# Patient Record
Sex: Male | Born: 1955 | Race: White | Hispanic: No | Smoking: Never smoker
Health system: Southern US, Community
[De-identification: ages and names within clinical notes are randomized; demographics above are authoritative.]

## PROBLEM LIST (undated history)

## (undated) DIAGNOSIS — E063 Autoimmune thyroiditis: Secondary | ICD-10-CM

## (undated) HISTORY — DX: Autoimmune thyroiditis: E06.3

## (undated) HISTORY — PX: HAND TENDON SURGERY: SHX663

---

## 2019-11-22 DIAGNOSIS — M25511 Pain in right shoulder: Secondary | ICD-10-CM | POA: Diagnosis not present

## 2019-11-22 DIAGNOSIS — Z7189 Other specified counseling: Secondary | ICD-10-CM | POA: Diagnosis not present

## 2019-11-22 DIAGNOSIS — Z8489 Family history of other specified conditions: Secondary | ICD-10-CM | POA: Diagnosis not present

## 2019-11-22 DIAGNOSIS — F339 Major depressive disorder, recurrent, unspecified: Secondary | ICD-10-CM | POA: Diagnosis not present

## 2019-11-23 DIAGNOSIS — Z125 Encounter for screening for malignant neoplasm of prostate: Secondary | ICD-10-CM | POA: Diagnosis not present

## 2019-11-23 DIAGNOSIS — E782 Mixed hyperlipidemia: Secondary | ICD-10-CM | POA: Diagnosis not present

## 2019-11-23 DIAGNOSIS — E785 Hyperlipidemia, unspecified: Secondary | ICD-10-CM | POA: Diagnosis not present

## 2019-11-23 DIAGNOSIS — E038 Other specified hypothyroidism: Secondary | ICD-10-CM | POA: Diagnosis not present

## 2019-11-23 DIAGNOSIS — E063 Autoimmune thyroiditis: Secondary | ICD-10-CM | POA: Diagnosis not present

## 2020-02-16 DIAGNOSIS — M25511 Pain in right shoulder: Secondary | ICD-10-CM | POA: Diagnosis not present

## 2020-02-16 DIAGNOSIS — F900 Attention-deficit hyperactivity disorder, predominantly inattentive type: Secondary | ICD-10-CM | POA: Diagnosis not present

## 2020-02-16 DIAGNOSIS — E038 Other specified hypothyroidism: Secondary | ICD-10-CM | POA: Diagnosis not present

## 2020-02-16 DIAGNOSIS — F339 Major depressive disorder, recurrent, unspecified: Secondary | ICD-10-CM | POA: Diagnosis not present

## 2020-03-03 DIAGNOSIS — E785 Hyperlipidemia, unspecified: Secondary | ICD-10-CM | POA: Diagnosis not present

## 2020-03-03 DIAGNOSIS — E038 Other specified hypothyroidism: Secondary | ICD-10-CM | POA: Diagnosis not present

## 2020-03-03 DIAGNOSIS — E063 Autoimmune thyroiditis: Secondary | ICD-10-CM | POA: Diagnosis not present

## 2020-03-03 DIAGNOSIS — E782 Mixed hyperlipidemia: Secondary | ICD-10-CM | POA: Diagnosis not present

## 2020-04-14 DIAGNOSIS — F900 Attention-deficit hyperactivity disorder, predominantly inattentive type: Secondary | ICD-10-CM | POA: Diagnosis not present

## 2020-04-14 DIAGNOSIS — F339 Major depressive disorder, recurrent, unspecified: Secondary | ICD-10-CM | POA: Diagnosis not present

## 2020-07-14 DIAGNOSIS — L219 Seborrheic dermatitis, unspecified: Secondary | ICD-10-CM | POA: Diagnosis not present

## 2020-07-14 DIAGNOSIS — F339 Major depressive disorder, recurrent, unspecified: Secondary | ICD-10-CM | POA: Diagnosis not present

## 2020-07-14 DIAGNOSIS — F900 Attention-deficit hyperactivity disorder, predominantly inattentive type: Secondary | ICD-10-CM | POA: Diagnosis not present

## 2020-07-14 DIAGNOSIS — G4700 Insomnia, unspecified: Secondary | ICD-10-CM | POA: Diagnosis not present

## 2020-09-15 DIAGNOSIS — F900 Attention-deficit hyperactivity disorder, predominantly inattentive type: Secondary | ICD-10-CM | POA: Diagnosis not present

## 2020-09-15 DIAGNOSIS — F339 Major depressive disorder, recurrent, unspecified: Secondary | ICD-10-CM | POA: Diagnosis not present

## 2020-09-15 DIAGNOSIS — L219 Seborrheic dermatitis, unspecified: Secondary | ICD-10-CM | POA: Diagnosis not present

## 2020-09-15 DIAGNOSIS — M6284 Sarcopenia: Secondary | ICD-10-CM | POA: Diagnosis not present

## 2020-09-28 DIAGNOSIS — R6882 Decreased libido: Secondary | ICD-10-CM | POA: Diagnosis not present

## 2020-12-20 DIAGNOSIS — Z8616 Personal history of COVID-19: Secondary | ICD-10-CM | POA: Diagnosis not present

## 2020-12-20 DIAGNOSIS — F339 Major depressive disorder, recurrent, unspecified: Secondary | ICD-10-CM | POA: Diagnosis not present

## 2020-12-20 DIAGNOSIS — F900 Attention-deficit hyperactivity disorder, predominantly inattentive type: Secondary | ICD-10-CM | POA: Diagnosis not present

## 2020-12-20 DIAGNOSIS — R21 Rash and other nonspecific skin eruption: Secondary | ICD-10-CM | POA: Diagnosis not present

## 2020-12-27 DIAGNOSIS — Z13220 Encounter for screening for lipoid disorders: Secondary | ICD-10-CM | POA: Diagnosis not present

## 2020-12-27 DIAGNOSIS — F339 Major depressive disorder, recurrent, unspecified: Secondary | ICD-10-CM | POA: Diagnosis not present

## 2020-12-27 DIAGNOSIS — Z125 Encounter for screening for malignant neoplasm of prostate: Secondary | ICD-10-CM | POA: Diagnosis not present

## 2020-12-27 DIAGNOSIS — R21 Rash and other nonspecific skin eruption: Secondary | ICD-10-CM | POA: Diagnosis not present

## 2020-12-27 DIAGNOSIS — Z8616 Personal history of COVID-19: Secondary | ICD-10-CM | POA: Diagnosis not present

## 2020-12-27 DIAGNOSIS — F900 Attention-deficit hyperactivity disorder, predominantly inattentive type: Secondary | ICD-10-CM | POA: Diagnosis not present

## 2021-03-14 DIAGNOSIS — F900 Attention-deficit hyperactivity disorder, predominantly inattentive type: Secondary | ICD-10-CM | POA: Diagnosis not present

## 2021-03-14 DIAGNOSIS — E785 Hyperlipidemia, unspecified: Secondary | ICD-10-CM | POA: Diagnosis not present

## 2021-03-14 DIAGNOSIS — E038 Other specified hypothyroidism: Secondary | ICD-10-CM | POA: Diagnosis not present

## 2021-03-14 DIAGNOSIS — F339 Major depressive disorder, recurrent, unspecified: Secondary | ICD-10-CM | POA: Diagnosis not present

## 2021-04-06 DIAGNOSIS — E039 Hypothyroidism, unspecified: Secondary | ICD-10-CM | POA: Diagnosis not present

## 2021-04-06 DIAGNOSIS — E063 Autoimmune thyroiditis: Secondary | ICD-10-CM | POA: Diagnosis not present

## 2021-04-06 DIAGNOSIS — E038 Other specified hypothyroidism: Secondary | ICD-10-CM | POA: Diagnosis not present

## 2021-05-30 ENCOUNTER — Ambulatory Visit: Payer: Self-pay | Admitting: Dermatology

## 2021-06-19 DIAGNOSIS — F900 Attention-deficit hyperactivity disorder, predominantly inattentive type: Secondary | ICD-10-CM | POA: Diagnosis not present

## 2021-06-19 DIAGNOSIS — J069 Acute upper respiratory infection, unspecified: Secondary | ICD-10-CM | POA: Diagnosis not present

## 2021-06-19 DIAGNOSIS — F339 Major depressive disorder, recurrent, unspecified: Secondary | ICD-10-CM | POA: Diagnosis not present

## 2021-06-19 DIAGNOSIS — E038 Other specified hypothyroidism: Secondary | ICD-10-CM | POA: Diagnosis not present

## 2021-09-14 DIAGNOSIS — Z7189 Other specified counseling: Secondary | ICD-10-CM | POA: Diagnosis not present

## 2021-09-14 DIAGNOSIS — E038 Other specified hypothyroidism: Secondary | ICD-10-CM | POA: Diagnosis not present

## 2021-09-14 DIAGNOSIS — F900 Attention-deficit hyperactivity disorder, predominantly inattentive type: Secondary | ICD-10-CM | POA: Diagnosis not present

## 2021-09-14 DIAGNOSIS — F339 Major depressive disorder, recurrent, unspecified: Secondary | ICD-10-CM | POA: Diagnosis not present

## 2021-10-03 DIAGNOSIS — F900 Attention-deficit hyperactivity disorder, predominantly inattentive type: Secondary | ICD-10-CM | POA: Diagnosis not present

## 2021-10-03 DIAGNOSIS — E063 Autoimmune thyroiditis: Secondary | ICD-10-CM | POA: Diagnosis not present

## 2021-10-03 DIAGNOSIS — R6882 Decreased libido: Secondary | ICD-10-CM | POA: Diagnosis not present

## 2021-10-03 DIAGNOSIS — F339 Major depressive disorder, recurrent, unspecified: Secondary | ICD-10-CM | POA: Diagnosis not present

## 2021-10-03 DIAGNOSIS — R21 Rash and other nonspecific skin eruption: Secondary | ICD-10-CM | POA: Diagnosis not present

## 2021-10-03 DIAGNOSIS — Z13220 Encounter for screening for lipoid disorders: Secondary | ICD-10-CM | POA: Diagnosis not present

## 2021-10-03 DIAGNOSIS — Z8616 Personal history of COVID-19: Secondary | ICD-10-CM | POA: Diagnosis not present

## 2021-10-03 DIAGNOSIS — E038 Other specified hypothyroidism: Secondary | ICD-10-CM | POA: Diagnosis not present

## 2021-10-03 DIAGNOSIS — E039 Hypothyroidism, unspecified: Secondary | ICD-10-CM | POA: Diagnosis not present

## 2021-10-03 DIAGNOSIS — Z125 Encounter for screening for malignant neoplasm of prostate: Secondary | ICD-10-CM | POA: Diagnosis not present

## 2022-02-25 ENCOUNTER — Ambulatory Visit (INDEPENDENT_AMBULATORY_CARE_PROVIDER_SITE_OTHER): Payer: PPO | Admitting: Family Medicine

## 2022-02-25 ENCOUNTER — Encounter: Payer: Self-pay | Admitting: Family Medicine

## 2022-02-25 VITALS — BP 116/68 | HR 85 | Ht 68.0 in | Wt 156.4 lb

## 2022-02-25 DIAGNOSIS — E559 Vitamin D deficiency, unspecified: Secondary | ICD-10-CM

## 2022-02-25 DIAGNOSIS — E063 Autoimmune thyroiditis: Secondary | ICD-10-CM | POA: Diagnosis not present

## 2022-02-25 DIAGNOSIS — F908 Attention-deficit hyperactivity disorder, other type: Secondary | ICD-10-CM

## 2022-02-25 DIAGNOSIS — F909 Attention-deficit hyperactivity disorder, unspecified type: Secondary | ICD-10-CM | POA: Diagnosis not present

## 2022-02-25 DIAGNOSIS — R7301 Impaired fasting glucose: Secondary | ICD-10-CM

## 2022-02-25 DIAGNOSIS — F988 Other specified behavioral and emotional disorders with onset usually occurring in childhood and adolescence: Secondary | ICD-10-CM

## 2022-02-25 DIAGNOSIS — Z1211 Encounter for screening for malignant neoplasm of colon: Secondary | ICD-10-CM

## 2022-02-25 DIAGNOSIS — Z1159 Encounter for screening for other viral diseases: Secondary | ICD-10-CM

## 2022-02-25 MED ORDER — AMPHETAMINE-DEXTROAMPHETAMINE 10 MG PO TABS: 30.0000 mg | ORAL_TABLET | Freq: Two times a day (BID) | ORAL | 0 refills | Status: DC

## 2022-02-25 NOTE — Progress Notes (Addendum)
? ?New Patient Office Visit ? ?Subjective:  ?Patient ID: Troy Allen, male    DOB: Aug 29, 1956  Age: 66 y.o. MRN: [id number] ? ?CC:  ?Chief Complaint  ?Patient presents with  ? New Patient (Initial Visit)  ?  Establishing care, would like a regular work up today. Possibly a referral to Endocrinology.   ? ? ?HPI ?NICHOLOS Allen is a 66 y.o. male with past medical history of Hashimoto's disease presents for establishing care. He takes Synthroid daily for his Hashimoto's but states that he has been feeling tired with low energy and poor concentration. He does not follow up with an endocrinologist for his Hashimoto because his tsh levels have been normal. He reports taking Adderall since 2017 to help with concentration and focus. ? ?Past Medical History:  ?Diagnosis Date  ? Hashimoto's disease   ? ? ?History reviewed. No pertinent surgical history. ? ?History reviewed. No pertinent family history. ? ?Social History  ? ?Socioeconomic History  ? Marital status: Divorced  ?  Spouse name: Not on file  ? Number of children: Not on file  ? Years of education: Not on file  ? Highest education level: Not on file  ?Occupational History  ? Not on file  ?Tobacco Use  ? Smoking status: Never  ?  Passive exposure: Never  ? Smokeless tobacco: Never  ?Substance and Sexual Activity  ? Alcohol use: Not Currently  ?  Comment: occasionally  ? Drug use: Never  ? Sexual activity: Yes  ?Other Topics Concern  ? Not on file  ?Social History Narrative  ? Not on file  ? ?Social Determinants of Health  ? ?Financial Resource Strain: Not on file  ?Food Insecurity: Not on file  ?Transportation Needs: Not on file  ?Physical Activity: Not on file  ?Stress: Not on file  ?Social Connections: Not on file  ?Intimate Partner Violence: Not on file  ? ? ?ROS ?Review of Systems  ?Constitutional:  Positive for fatigue. Negative for chills and fever.  ?HENT:  Negative for congestion, sinus pressure, sinus pain, sneezing and sore throat.   ?Eyes:   Negative for discharge, redness and itching.  ?Respiratory:  Negative for cough, chest tightness, shortness of breath and wheezing.   ?Cardiovascular:  Negative for chest pain and palpitations.  ?Gastrointestinal:  Negative for constipation, diarrhea, nausea and vomiting.  ?Endocrine: Negative for polydipsia, polyphagia and polyuria.  ?Genitourinary:  Negative for frequency and urgency.  ?Musculoskeletal:  Negative for back pain and neck pain.  ?Skin:  Negative for rash and wound.  ?Neurological:  Negative for dizziness, tremors, light-headedness and headaches.  ?Psychiatric/Behavioral:  Negative for confusion, self-injury and suicidal ideas.   ? ?Objective:  ? ?Today's Vitals: BP 116/68   Pulse 85   Ht _0  (1.727 m)   Wt 156 lb 6.4 oz (70.9 kg)   SpO2 98%   BMI 23.78 kg/m?  ? ?Physical Exam ?Constitutional:   ?   Appearance: Normal appearance.  ?HENT:  ?   Head: Normocephalic.  ?   Right Ear: External ear normal.  ?   Left Ear: External ear normal.  ?   Nose: No congestion or rhinorrhea.  ?   Mouth/Throat:  ?   Mouth: Mucous membranes are moist.  ?Eyes:  ?   Extraocular Movements: Extraocular movements intact.  ?   Pupils: Pupils are equal, round, and reactive to light.  ?Cardiovascular:  ?   Rate and Rhythm: Normal rate and regular rhythm.  ?   Pulses: Normal  pulses.  ?   Heart sounds: Normal heart sounds.  ?Pulmonary:  ?   Effort: Pulmonary effort is normal.  ?   Breath sounds: Normal breath sounds.  ?Abdominal:  ?   Palpations: Abdomen is soft.  ?Musculoskeletal:     ?   General: No swelling.  ?   Cervical back: No rigidity.  ?Lymphadenopathy:  ?   Cervical: No cervical adenopathy.  ?Skin: ?   General: Skin is warm.  ?   Capillary Refill: Capillary refill takes less than 2 seconds.  ?   Findings: No lesion or rash.  ?Neurological:  ?   Mental Status: He is alert and oriented to person, place, and time.  ?Psychiatric:  ?   Comments: Normal affect  ? ? ?Assessment & Plan:  ? ?Problem List Items Addressed  This Visit   ? ?  ? Endocrine  ? Hashimoto's disease - Primary  ?  Patient reports that his labs were recently drawn at his previous practice ?Records requested ?Informed patient to take liquid Vit. B12 OTC to help with his symptoms of fatigue and low energy ?Advise patient to have labs drawn 2-3 days before his next appt. ?Inform the patient that we wait and see his thyroid levels before placing a referral to see endocrinologist ? ?  ?  ? Relevant Medications  ? levothyroxine (SYNTHROID) 137 MCG tablet  ?  ? Other  ? ADD (attention deficit disorder)  ?  Refilled adderall ? ?  ?  ? Relevant Medications  ? amphetamine-dextroamphetamine (ADDERALL) 10 MG tablet  ? Other Relevant Orders  ? CBC with Differential/Platelet  ? CMP14+EGFR  ? TSH+T4F+T3Free  ? Lipid Profile  ? ?Other Visit Diagnoses   ? ? Colon cancer screening      ? Relevant Orders  ? Cologuard  ? IFG (impaired fasting glucose)      ? Relevant Orders  ? Hemoglobin A1C  ? Vitamin D deficiency      ? Relevant Orders  ? Vitamin D (25 hydroxy)  ? Need for hepatitis C screening test      ? Relevant Orders  ? Hepatitis C Antibody  ? ?  ? ? ?Outpatient Encounter Medications as of 02/25/2022  ?Medication Sig  ? levothyroxine (SYNTHROID) 137 MCG tablet Take 137 mcg by mouth daily before breakfast.  ? Venlafaxine HCl 37.5 MG TB24 Take 1 tablet by mouth daily.  ? [DISCONTINUED] amphetamine-dextroamphetamine (ADDERALL) 10 MG tablet Take 30 mg by mouth 2 (two) times daily.  ? amphetamine-dextroamphetamine (ADDERALL) 10 MG tablet Take 3 tablets (30 mg total) by mouth 2 (two) times daily.  ? ?No facility-administered encounter medications on file as of 02/25/2022.  ? ? ?Follow-up: Return in about 3 months (around 05/28/2022).  ? ?Alvira Monday, FNP ?

## 2022-02-25 NOTE — Patient Instructions (Addendum)
I appreciate the opportunity to provide care to you today! ? ?  ?Follow up:  3 months ? ?Please inform the patient that I sent an order for Synthroid. Her thyroid  ?Labs:  we will wait for your medical records and get labs at your next visit ? ?Vaccinations: please stop by your local pharmacy to receive your Tdap, shingrix, and Pneumococcal polysaccharide vaccine (PPSV23) ? ?-Colon caner screening: Cologuard ? ? Refill: Adderall ? ?  ?Please continue to a heart-healthy diet and increase your physical activities. Try to exercise for at least three times a week.  ? ? ?  ?It was a pleasure to see you and I look forward to continuing to work together on your health and well-being. ?Please do not hesitate to call the office if you need care or have questions about your care. ?  ?Have a wonderful day and week. ?With Gratitude, ?Gilmore Laroche MSN, FNP-BC  ?

## 2022-02-27 DIAGNOSIS — E063 Autoimmune thyroiditis: Secondary | ICD-10-CM | POA: Insufficient documentation

## 2022-02-27 DIAGNOSIS — F988 Other specified behavioral and emotional disorders with onset usually occurring in childhood and adolescence: Secondary | ICD-10-CM | POA: Insufficient documentation

## 2022-02-27 NOTE — Assessment & Plan Note (Addendum)
Patient reports that his labs were recently drawn at his previous practice ?Records requested ?Informed patient to take liquid Vit. B12 OTC to help with his symptoms of fatigue and low energy ?Advise patient to have labs drawn 2-3 days before his next appt. ?Inform the patient that we wait and see his thyroid levels before placing a referral to see endocrinologist ?

## 2022-02-27 NOTE — Assessment & Plan Note (Signed)
Refilled adderall.

## 2022-03-19 LAB — COLOGUARD: COLOGUARD: NEGATIVE

## 2022-03-20 NOTE — Progress Notes (Signed)
Please inform the patient that his cologurad is negative for colon cancer.

## 2022-03-31 ENCOUNTER — Emergency Department (HOSPITAL_COMMUNITY)
Admission: EM | Admit: 2022-03-31 | Discharge: 2022-03-31 | Disposition: A | Discharge status: Home / Self Care | Payer: PPO | Attending: Emergency Medicine | Admitting: Emergency Medicine

## 2022-03-31 ENCOUNTER — Emergency Department (HOSPITAL_COMMUNITY): Payer: PPO

## 2022-03-31 ENCOUNTER — Encounter (HOSPITAL_COMMUNITY): Payer: Self-pay

## 2022-03-31 ENCOUNTER — Other Ambulatory Visit: Payer: Self-pay

## 2022-03-31 DIAGNOSIS — Z23 Encounter for immunization: Secondary | ICD-10-CM | POA: Diagnosis not present

## 2022-03-31 DIAGNOSIS — S61210A Laceration without foreign body of right index finger without damage to nail, initial encounter: Secondary | ICD-10-CM | POA: Insufficient documentation

## 2022-03-31 DIAGNOSIS — W208XXA Other cause of strike by thrown, projected or falling object, initial encounter: Secondary | ICD-10-CM | POA: Diagnosis not present

## 2022-03-31 DIAGNOSIS — S6991XA Unspecified injury of right wrist, hand and finger(s), initial encounter: Secondary | ICD-10-CM | POA: Diagnosis present

## 2022-03-31 MED ORDER — TETANUS-DIPHTH-ACELL PERTUSSIS 5-2.5-18.5 LF-MCG/0.5 IM SUSY
0.5000 mL | PREFILLED_SYRINGE | Freq: Once | INTRAMUSCULAR | Status: AC
  Administered 2022-03-31: 0.5 mL via INTRAMUSCULAR
  Filled 2022-03-31: qty 0.5

## 2022-03-31 MED ORDER — LIDOCAINE HCL (PF) 1 % IJ SOLN
5.0000 mL | Freq: Once | INTRAMUSCULAR | Status: DC
Start: 2022-03-31 — End: 2022-03-31
  Filled 2022-03-31: qty 5

## 2022-03-31 NOTE — ED Triage Notes (Signed)
Pt reports the window of a storm door fell down and hit pt in the head causing a minute abrasion and pt has a lac to the R index finger that is bandaged prior to triage. Pt denies LOC and is not on anticoagulants.

## 2022-03-31 NOTE — ED Provider Notes (Signed)
[facility] EMERGENCY DEPARTMENT Provider Note   CSN: [id number] Arrival date & time: 03/31/22  1108     History  No chief complaint on file.   Troy Allen is a 66 y.o. male.  HPI     Troy Allen is a 66 y.o. male who presents to the Emergency Department complaining of laceration of his right index finger that occurred from a fall when the pain.  He states the metal portion of the window fell striking his finger.  He has a laceration to the palmar aspect of the mid to distal finger.  Bleeding controlled with direct pressure prior to arrival.  He denies any numbness, tingling of the finger or hand.  States he is able to move his finger without difficulty.  Last Td is unknown.  No pain of the wrist or arm.  He does not take anticoagulants.  Home Medications Prior to Admission medications   Medication Sig Start Date End Date Taking? Authorizing Provider  amphetamine-dextroamphetamine (ADDERALL) 10 MG tablet Take 3 tablets (30 mg total) by mouth 2 (two) times daily. 02/25/22   Gilmore Laroche, FNP  levothyroxine (SYNTHROID) 137 MCG tablet Take 137 mcg by mouth daily before breakfast.    [provider]  Venlafaxine HCl 37.5 MG TB24 Take 1 tablet by mouth daily. 01/11/22   [provider]      Allergies    Patient has no known allergies.    Review of Systems   Review of Systems  Constitutional:  Negative for chills and fever.  Respiratory:  Negative for shortness of breath.   Cardiovascular:  Negative for chest pain.  Gastrointestinal:  Negative for nausea and vomiting.  Musculoskeletal:  Positive for arthralgias (Right index finger pain).  Skin:  Positive for wound (Laceration right index finger). Negative for color change.  Neurological:  Negative for dizziness, syncope, weakness, numbness and headaches.    Physical Exam Updated Vital Signs BP 124/71 (BP Location: Left Arm)   Pulse 81   Temp 97.9 F (36.6 C) (Oral)   Resp 16   Ht 5\' 8"  (1.727  m)   Wt 68 kg   SpO2 100%   BMI 22.81 kg/m  Physical Exam Vitals and nursing note reviewed.  Constitutional:      General: He is not in acute distress.    Appearance: Normal appearance. He is not ill-appearing.  Cardiovascular:     Rate and Rhythm: Normal rate and regular rhythm.     Pulses: Normal pulses.  Pulmonary:     Effort: Pulmonary effort is normal.  Chest:     Chest wall: No tenderness.  Musculoskeletal:        General: Tenderness and signs of injury present. Normal range of motion.     Cervical back: Normal range of motion.     Comments: 2 cm laceration to the palmar aspect of the mid to distal right index finger.  Bleeding controlled.  No edema.  Patient has full range of motion of the PIP and DIP joints.  No active bleeding.  No foreign body seen.  Skin:    General: Skin is warm.     Capillary Refill: Capillary refill takes less than 2 seconds.  Neurological:     General: No focal deficit present.     Mental Status: He is alert.     Sensory: No sensory deficit.     Motor: No weakness.     ED Results / Procedures / Treatments   Labs (all labs  ordered are listed, but only abnormal results are displayed) Labs Reviewed - No data to display  EKG None  Radiology DG Finger Index Right  Result Date: 03/31/2022 CLINICAL DATA:  Trauma to finger.  Laceration. EXAM: RIGHT INDEX FINGER 2+V COMPARISON:  None Available. FINDINGS: Soft tissue laceration noted along the ventral aspect of the finger. No underlying fracture or foreign body. Chronic degenerative changes are noted at the DIP joint. IMPRESSION: Soft tissue laceration without underlying fracture or foreign body. Electronically Signed   By: Marin Roberts M.D.   On: 03/31/2022 13:21    Procedures Procedures     LACERATION REPAIR Performed by: Fountain Derusha Authorized by: Tarry Blayney Consent: Verbal consent obtained. Risks and benefits: risks, benefits and alternatives were discussed Consent given  by: patient Patient identity confirmed: provided demographic data Prepped and Draped in normal sterile fashion Wound explored  Laceration Location: right index finger  Laceration Length: 2cm  No Foreign Bodies seen or palpated  Anesthesia: local infiltration  Local anesthetic: lidocaine 1% w/o epinephrine  Anesthetic total: 2 ml  Irrigation method: syringe Amount of cleaning: standard  Skin closure: 4-0 Ethilon  Number of sutures: 4  Technique: simple interrupted  Patient tolerance: Patient tolerated the procedure well with no immediate complications.  Medications Ordered in ED Medications  lidocaine (PF) (XYLOCAINE) 1 % injection 5 mL (has no administration in time range)  Tdap (BOOSTRIX) injection 0.5 mL (has no administration in time range)    ED Course/ Medical Decision Making/ A&P                           Medical Decision Making Patient here for evaluation of laceration of his right index finger that occurred from a fall 1 windowpane.  Laceration is to the distal finger and does not involve a joint.  No injury of the nail.  Neurovascularly intact.  Bleeding controlled prior to arrival. Patient has full range of motion and normal finger thumb opposition.  Wound explored through full range of motion and entire depth of the wound.  No foreign body seen no tendon injury seen.  Wound cleaned and well approximated with sutures.  Triage note mentions abrasion to the scalp without LOC.  Patient did not mention head injury to me on my exam.  He is mentating well, ambulatory with steady gait.  Denied any pain other than finger injury.  Amount and/or Complexity of Data Reviewed Radiology: ordered.    Details: X-ray of the finger without bony injury or radiopaque foreign body. Discussion of management or test interpretation with external provider(s): TD updated.  Patient tolerated suture procedure well.  He is agreeable to wound care instructions, sutures out in 8 to 10 days.   Return precautions discussed.           Final Clinical Impression(s) / ED Diagnoses Final diagnoses:  Laceration of right index finger without foreign body without damage to nail, initial encounter    Rx / DC Orders ED Discharge Orders     None         Pauline Aus, PA-C 03/31/22 1426    Jacalyn Lefevre, MD 03/31/22 1504

## 2022-03-31 NOTE — Discharge Instructions (Signed)
Keep the wound clean with mild soap and water.  Keep it bandaged.  Sutures out in 8 to 10 days.  Return the emergency department for any worsening symptoms or signs of infection.  You may take Tylenol if needed for pain.

## 2022-03-31 NOTE — ED Notes (Signed)
Pt provided discharge instructions and prescription information. Pt was given the opportunity to ask questions and questions were answered.   

## 2022-04-23 ENCOUNTER — Other Ambulatory Visit: Payer: Self-pay

## 2022-04-23 ENCOUNTER — Other Ambulatory Visit: Payer: Self-pay | Admitting: Family Medicine

## 2022-04-23 ENCOUNTER — Telehealth: Payer: Self-pay | Admitting: Family Medicine

## 2022-04-23 DIAGNOSIS — F988 Other specified behavioral and emotional disorders with onset usually occurring in childhood and adolescence: Secondary | ICD-10-CM

## 2022-04-23 NOTE — Telephone Encounter (Addendum)
Patient called in for refill on   amphetamine-dextroamphetamine (ADDERALL) 10 MG tablet     May call patient back if needed. Patient has 1-2 days of med left.

## 2022-04-24 ENCOUNTER — Other Ambulatory Visit: Payer: Self-pay | Admitting: Family Medicine

## 2022-04-24 DIAGNOSIS — F988 Other specified behavioral and emotional disorders with onset usually occurring in childhood and adolescence: Secondary | ICD-10-CM

## 2022-04-24 MED ORDER — AMPHETAMINE-DEXTROAMPHETAMINE 10 MG PO TABS: 30.0000 mg | ORAL_TABLET | Freq: Two times a day (BID) | ORAL | 0 refills | Status: DC

## 2022-04-24 NOTE — Telephone Encounter (Signed)
Left a vm letting patient know.

## 2022-04-25 ENCOUNTER — Telehealth: Payer: Self-pay

## 2022-04-25 NOTE — Telephone Encounter (Signed)
Patient called he states the pharmacy only gave him 9 adderall tablets.He takes 3 tabs 2 times daily.Requesting a call back.[phone]

## 2022-04-26 ENCOUNTER — Other Ambulatory Visit: Payer: Self-pay | Admitting: Family Medicine

## 2022-04-26 DIAGNOSIS — F988 Other specified behavioral and emotional disorders with onset usually occurring in childhood and adolescence: Secondary | ICD-10-CM

## 2022-04-26 MED ORDER — AMPHETAMINE-DEXTROAMPHETAMINE 10 MG PO TABS: 30.0000 mg | ORAL_TABLET | Freq: Two times a day (BID) | ORAL | 0 refills | Status: DC

## 2022-04-26 NOTE — Telephone Encounter (Signed)
Refills have been sent. Please inform the patient of the current dose that he is taking is not safe. I plan on titrating the dose from 30 to 20 to 15 and 10mg .

## 2022-04-26 NOTE — Telephone Encounter (Signed)
Called pt back states he uses to be on 30mg  2x a day, but last year there was a short supply of adderal in pharmacies so this was the only way he could have the prescription written based on the supply at pharmacy, states he was getting refills from previous PCP, would like to have his regular script written if not he doesn't know where he can get this from. Please advice?

## 2022-04-26 NOTE — Telephone Encounter (Signed)
Pt informed

## 2022-05-13 ENCOUNTER — Other Ambulatory Visit: Payer: Self-pay | Admitting: Family Medicine

## 2022-05-13 DIAGNOSIS — F988 Other specified behavioral and emotional disorders with onset usually occurring in childhood and adolescence: Secondary | ICD-10-CM

## 2022-05-28 ENCOUNTER — Ambulatory Visit: Payer: PPO | Admitting: Family Medicine

## 2022-05-28 NOTE — Telephone Encounter (Signed)
Please inform the patient that we will discuss his refill at his appt tomorrow

## 2022-06-03 NOTE — Telephone Encounter (Signed)
Left a vm informing pt.  

## 2022-06-05 ENCOUNTER — Ambulatory Visit (INDEPENDENT_AMBULATORY_CARE_PROVIDER_SITE_OTHER): Payer: PPO | Admitting: Family Medicine

## 2022-06-05 ENCOUNTER — Encounter: Payer: Self-pay | Admitting: Family Medicine

## 2022-06-05 VITALS — BP 115/71 | HR 74 | Ht 68.0 in | Wt 158.1 lb

## 2022-06-05 DIAGNOSIS — E063 Autoimmune thyroiditis: Secondary | ICD-10-CM

## 2022-06-05 DIAGNOSIS — Z1159 Encounter for screening for other viral diseases: Secondary | ICD-10-CM

## 2022-06-05 DIAGNOSIS — E559 Vitamin D deficiency, unspecified: Secondary | ICD-10-CM | POA: Diagnosis not present

## 2022-06-05 DIAGNOSIS — R7301 Impaired fasting glucose: Secondary | ICD-10-CM

## 2022-06-05 DIAGNOSIS — F321 Major depressive disorder, single episode, moderate: Secondary | ICD-10-CM

## 2022-06-05 DIAGNOSIS — F908 Attention-deficit hyperactivity disorder, other type: Secondary | ICD-10-CM

## 2022-06-05 DIAGNOSIS — F32A Depression, unspecified: Secondary | ICD-10-CM | POA: Insufficient documentation

## 2022-06-05 MED ORDER — LEVOTHYROXINE SODIUM 137 MCG PO TABS: 137.0000 ug | ORAL_TABLET | Freq: Every day | ORAL | 2 refills | Status: DC

## 2022-06-05 MED ORDER — AMPHETAMINE-DEXTROAMPHETAMINE 20 MG PO TABS: 20.0000 mg | ORAL_TABLET | Freq: Two times a day (BID) | ORAL | 0 refills | Status: DC

## 2022-06-05 MED ORDER — VENLAFAXINE HCL ER 37.5 MG PO TB24: 1.0000 | ORAL_TABLET | Freq: Every day | ORAL | 2 refills | Status: DC

## 2022-06-05 NOTE — Assessment & Plan Note (Signed)
Reports taking  Adderall since 2017 to help with concentration and focus Will titrate pt to  Adderall 20 mg BID Referral to Psychiatry

## 2022-06-05 NOTE — Progress Notes (Signed)
Established Patient Office Visit  Subjective:  Patient ID: RECE ZECHMAN, male    DOB: 1956-09-01  Age: 66 y.o. MRN: [id number]  CC:  Chief Complaint  Patient presents with   Follow-up    Follow up appt, would like to discuss adhd medication     HPI Troy Allen is a 66 y.o. male with past medical history of ADD, Depression and Hashimoto's disease presents for f/u of  chronic medical conditions. Hashimoto's disease: He takes Synthroid daily for his Hashimoto's but states that he has been feeling tired with low energy and poor concentration. He does not follow up with an endocrinologist for his Hashimoto because his tsh levels have been normal Attention deficit disorder: reports taking  Adderall since 2017 to help with concentration and focus Depression: c/o of increased fatigue but is unsure if his fatigue is due to his depression or hashimoto's. He takes venlafaxine daily.   Past Medical History:  Diagnosis Date   Hashimoto's disease     Past Surgical History:  Procedure Laterality Date   HAND TENDON SURGERY Left     History reviewed. No pertinent family history.  Social History   Socioeconomic History   Marital status: Divorced    Spouse name: Not on file   Number of children: Not on file   Years of education: Not on file   Highest education level: Not on file  Occupational History   Not on file  Tobacco Use   Smoking status: Never    Passive exposure: Never   Smokeless tobacco: Never  Vaping Use   Vaping Use: Every day  Substance and Sexual Activity   Alcohol use: Yes    Comment: occasionally   Drug use: Yes    Types: Marijuana   Sexual activity: Yes  Other Topics Concern   Not on file  Social History Narrative   Not on file   Social Determinants of Health   Financial Resource Strain: Not on file  Food Insecurity: Not on file  Transportation Needs: Not on file  Physical Activity: Not on file  Stress: Not on file  Social Connections: Not on  file  Intimate Partner Violence: Not on file    Outpatient Medications Prior to Visit  Medication Sig Dispense Refill   amphetamine-dextroamphetamine (ADDERALL) 10 MG tablet Take 3 tablets (30 mg total) by mouth 2 (two) times daily. 51 tablet 0   levothyroxine (SYNTHROID) 137 MCG tablet Take 137 mcg by mouth daily before breakfast.     Venlafaxine HCl 37.5 MG TB24 Take 1 tablet by mouth daily.     No facility-administered medications prior to visit.    No Known Allergies  ROS Review of Systems    Objective:    Physical Exam  BP 115/71   Pulse 74   Ht $R'5\' 8"'Dl$  (1.727 m)   Wt 158 lb 1.9 oz (71.7 kg)   SpO2 95%   BMI 24.04 kg/m  Wt Readings from Last 3 Encounters:  06/05/22 158 lb 1.9 oz (71.7 kg)  03/31/22 150 lb (68 kg)  02/25/22 156 lb 6.4 oz (70.9 kg)    No results found for: "TSH" No results found for: "WBC", "HGB", "HCT", "MCV", "PLT" No results found for: "NA", "K", "CHLORIDE", "CO2", "GLUCOSE", "BUN", "CREATININE", "BILITOT", "ALKPHOS", "AST", "ALT", "PROT", "ALBUMIN", "CALCIUM", "ANIONGAP", "EGFR", "GFR" No results found for: "CHOL" No results found for: "HDL" No results found for: "LDLCALC" No results found for: "TRIG" No results found for: "CHOLHDL" No results found for: "HGBA1C"  Assessment & Plan:   Problem List Items Addressed This Visit       Endocrine   Hashimoto's disease - Primary    He takes Synthroid daily for his Hashimoto's but states that he has been feeling tired with low energy and poor concentration He does not follow up with an endocrinologist for his Hashimoto because his tsh levels have been normal Pending labs (TSH And free T4)      Relevant Medications   levothyroxine (SYNTHROID) 137 MCG tablet     Other   ADD (attention deficit disorder)    Reports taking  Adderall since 2017 to help with concentration and focus Will titrate pt to  Adderall 20 mg BID Referral to Psychiatry       Relevant Medications    amphetamine-dextroamphetamine (ADDERALL) 20 MG tablet   Other Relevant Orders   Ambulatory referral to Psychiatry   Depression    c/o of increased fatigue but is unsure if his fatigue is due to his depression or Hashimoto's He takes venlafaxine daily Refilled venlafaxine      Relevant Medications   Venlafaxine HCl 37.5 MG TB24   Other Relevant Orders   Ambulatory referral to Psychiatry   Other Visit Diagnoses     Need for hepatitis C screening test       Relevant Orders   Hepatitis C Antibody   Vitamin D deficiency       Relevant Orders   VITAMIN D 25 Hydroxy (Vit-D Deficiency, Fractures)   IFG (impaired fasting glucose)       Relevant Orders   CBC with Differential/Platelet   CMP14+EGFR   TSH + free T4   Lipid Profile   Hemoglobin A1C       Meds ordered this encounter  Medications   amphetamine-dextroamphetamine (ADDERALL) 20 MG tablet    Sig: Take 1 tablet (20 mg total) by mouth 2 (two) times daily.    Dispense:  60 tablet    Refill:  0   levothyroxine (SYNTHROID) 137 MCG tablet    Sig: Take 1 tablet (137 mcg total) by mouth daily before breakfast.    Dispense:  30 tablet    Refill:  2   Venlafaxine HCl 37.5 MG TB24    Sig: Take 1 tablet (37.5 mg total) by mouth daily.    Dispense:  30 tablet    Refill:  2    Follow-up: Return in about 3 months (around 09/05/2022).    Alvira Monday, FNP

## 2022-06-05 NOTE — Assessment & Plan Note (Signed)
c/o of increased fatigue but is unsure if his fatigue is due to his depression or Hashimoto's He takes venlafaxine daily Refilled venlafaxine

## 2022-06-05 NOTE — Patient Instructions (Signed)
I appreciate the opportunity to provide care to you today!    Follow up:  3 months  Labs: please stop by the lab tomorrow to get your blood drawn (CBC, CMP, TSH, Lipid profile, HgA1c, Vit D)  Screening: Hep C  Please pick up your refills at the pharmacy  Referrals today- psychiatry   Please continue to a heart-healthy diet and increase your physical activities. Try to exercise for at least three times a week.      It was a pleasure to see you and I look forward to continuing to work together on your health and well-being. Please do not hesitate to call the office if you need care or have questions about your care.   Have a wonderful day and week. With Gratitude, Gilmore Laroche MSN, FNP-BC

## 2022-06-05 NOTE — Assessment & Plan Note (Signed)
He takes Synthroid daily for his Hashimoto's but states that he has been feeling tired with low energy and poor concentration He does not follow up with an endocrinologist for his Hashimoto because his tsh levels have been normal Pending labs (TSH And free T4)

## 2022-06-07 LAB — CBC WITH DIFFERENTIAL/PLATELET
Basophils Absolute: 0.1 10*3/uL (ref 0.0–0.2)
Basos: 1 %
EOS (ABSOLUTE): 0.3 10*3/uL (ref 0.0–0.4)
Eos: 4 %
Hematocrit: 40.6 % (ref 37.5–51.0)
Hemoglobin: 13.6 g/dL (ref 13.0–17.7)
Immature Grans (Abs): 0 10*3/uL (ref 0.0–0.1)
Immature Granulocytes: 0 %
Lymphocytes Absolute: 2.1 10*3/uL (ref 0.7–3.1)
Lymphs: 32 %
MCH: 33.3 pg — ABNORMAL HIGH (ref 26.6–33.0)
MCHC: 33.5 g/dL (ref 31.5–35.7)
MCV: 99 fL — ABNORMAL HIGH (ref 79–97)
Monocytes Absolute: 0.6 10*3/uL (ref 0.1–0.9)
Monocytes: 9 %
Neutrophils Absolute: 3.5 10*3/uL (ref 1.4–7.0)
Neutrophils: 54 %
Platelets: 274 10*3/uL (ref 150–450)
RBC: 4.09 x10E6/uL — ABNORMAL LOW (ref 4.14–5.80)
RDW: 12.7 % (ref 11.6–15.4)
WBC: 6.6 10*3/uL (ref 3.4–10.8)

## 2022-06-07 LAB — CMP14+EGFR
ALT: 17 IU/L (ref 0–44)
AST: 19 IU/L (ref 0–40)
Albumin/Globulin Ratio: 1.6 (ref 1.2–2.2)
Albumin: 4.1 g/dL (ref 3.9–4.9)
Alkaline Phosphatase: 65 IU/L (ref 44–121)
BUN/Creatinine Ratio: 15 (ref 10–24)
BUN: 13 mg/dL (ref 8–27)
Bilirubin Total: 0.3 mg/dL (ref 0.0–1.2)
CO2: 26 mmol/L (ref 20–29)
Calcium: 9.2 mg/dL (ref 8.6–10.2)
Chloride: 100 mmol/L (ref 96–106)
Creatinine, Ser: 0.87 mg/dL (ref 0.76–1.27)
Globulin, Total: 2.5 g/dL (ref 1.5–4.5)
Glucose: 84 mg/dL (ref 70–99)
Potassium: 4.7 mmol/L (ref 3.5–5.2)
Sodium: 138 mmol/L (ref 134–144)
Total Protein: 6.6 g/dL (ref 6.0–8.5)
eGFR: 95 mL/min/{1.73_m2} (ref >59)

## 2022-06-07 LAB — TSH+FREE T4
Free T4: 1.51 ng/dL (ref 0.82–1.77)
TSH: 0.952 u[IU]/mL (ref 0.450–4.500)

## 2022-06-07 LAB — HEPATITIS C ANTIBODY: Hep C Virus Ab: NONREACTIVE

## 2022-06-07 LAB — LIPID PANEL
Chol/HDL Ratio: 4.6 ratio (ref 0.0–5.0)
Cholesterol, Total: 189 mg/dL (ref 100–199)
HDL: 41 mg/dL (ref >39)
LDL Chol Calc (NIH): 128 mg/dL — ABNORMAL HIGH (ref 0–99)
Triglycerides: 111 mg/dL (ref 0–149)
VLDL Cholesterol Cal: 20 mg/dL (ref 5–40)

## 2022-06-07 LAB — HEMOGLOBIN A1C
Est. average glucose Bld gHb Est-mCnc: 111 mg/dL
Hgb A1c MFr Bld: 5.5 % (ref 4.8–5.6)

## 2022-06-07 LAB — VITAMIN D 25 HYDROXY (VIT D DEFICIENCY, FRACTURES): Vit D, 25-Hydroxy: 33.9 ng/mL (ref 30.0–100.0)

## 2022-06-10 ENCOUNTER — Ambulatory Visit (INDEPENDENT_AMBULATORY_CARE_PROVIDER_SITE_OTHER): Payer: PPO

## 2022-06-10 DIAGNOSIS — Z0000 Encounter for general adult medical examination without abnormal findings: Secondary | ICD-10-CM | POA: Diagnosis not present

## 2022-06-10 NOTE — Progress Notes (Signed)
I connected with  [name] on 06/10/22 by a audio enabled telemedicine application and verified that I am speaking with the correct person using two identifiers.  Patient Location: Home  Provider Location: Office/Clinic  I discussed the limitations of evaluation and management by telemedicine. The patient expressed understanding and agreed to proceed.  Subjective:   Troy Allen is a 66 y.o. male who presents for Medicare Annual/Subsequent preventive examination.  Review of Systems     Cardiac Risk Factors include: advanced age (>70men, >56 women);smoking/ tobacco exposure;male gender     Objective:    There were no vitals filed for this visit. There is no height or weight on file to calculate BMI.     06/10/2022   12:33 PM 03/31/2022   11:50 AM  Advanced Directives  Does Patient Have a Medical Advance Directive? No No  Would patient like information on creating a medical advance directive? Yes (ED - Information included in AVS)     Current Medications (verified) Outpatient Encounter Medications as of 06/10/2022  Medication Sig   amphetamine-dextroamphetamine (ADDERALL) 10 MG tablet Take 3 tablets (30 mg total) by mouth 2 (two) times daily.   amphetamine-dextroamphetamine (ADDERALL) 20 MG tablet Take 1 tablet (20 mg total) by mouth 2 (two) times daily.   levothyroxine (SYNTHROID) 137 MCG tablet Take 1 tablet (137 mcg total) by mouth daily before breakfast.   Venlafaxine HCl 37.5 MG TB24 Take 1 tablet (37.5 mg total) by mouth daily.   No facility-administered encounter medications on file as of 06/10/2022.    Allergies (verified) Patient has no known allergies.   History: Past Medical History:  Diagnosis Date   Hashimoto's disease    Past Surgical History:  Procedure Laterality Date   HAND TENDON SURGERY Left    No family history on file. Social History   Socioeconomic History   Marital status: Divorced    Spouse name: Not on file   Number of  children: Not on file   Years of education: Not on file   Highest education level: Not on file  Occupational History   Not on file  Tobacco Use   Smoking status: Never    Passive exposure: Never   Smokeless tobacco: Never  Vaping Use   Vaping Use: Every day  Substance and Sexual Activity   Alcohol use: Yes    Comment: occasionally   Drug use: Yes    Types: Marijuana   Sexual activity: Yes  Other Topics Concern   Not on file  Social History Narrative   Not on file   Social Determinants of Health   Financial Resource Strain: Not on file  Food Insecurity: Not on file  Transportation Needs: No Transportation Needs (06/10/2022)   PRAPARE - Transportation    Lack of Transportation (Medical): No    Lack of Transportation (Non-Medical): No  Physical Activity: Sufficiently Active (06/10/2022)   Exercise Vital Sign    Days of Exercise per Week: 5 days    Minutes of Exercise per Session: 60 min  Stress: Not on file  Social Connections: Not on file    Tobacco Counseling Counseling given: Not Answered   Clinical Intake:  Pre-visit preparation completed: No  Pain : No/denies pain     Nutritional Status: BMI of 19-24  Normal     Diabetic?na         Activities of Daily Living    06/10/2022   12:36 PM  In your present state of health, do you have any  difficulty performing the following activities:  Hearing? 0  Vision? 0  Difficulty concentrating or making decisions? 0  Walking or climbing stairs? 0  Dressing or bathing? 0  Doing errands, shopping? 0  Preparing Food and eating ? N  Using the Toilet? N  In the past six months, have you accidently leaked urine? N  Do you have problems with loss of bowel control? N  Managing your Medications? N  Managing your Finances? N  Housekeeping or managing your Housekeeping? N    Patient Care Team: Gilmore Laroche, FNP as PCP - General (Family Medicine)  Indicate any recent Medical Services you may have received from  other than [facility] providers in the past year (date may be approximate).     Assessment:   This is a routine wellness examination for Troy Allen.  Hearing/Vision screen No results found.  Dietary issues and exercise activities discussed: Current Exercise Habits: Structured exercise class, Type of exercise: strength training/weights;stretching;treadmill, Time (Minutes): 50, Frequency (Times/Week): 6, Weekly Exercise (Minutes/Week): 300, Intensity: Moderate, Exercise limited by: None identified   Goals Addressed             This Visit's Progress    Have 3 meals a day       Prevent falls         Depression Screen    06/05/2022   11:35 AM 02/25/2022    1:47 PM  PHQ 2/9 Scores  PHQ - 2 Score 0 2  PHQ- 9 Score  3    Fall Risk    06/05/2022   11:35 AM 02/25/2022    1:47 PM  Fall Risk   Falls in the past year? 0 0  Number falls in past yr: 0 0  Injury with Fall? 0 0  Risk for fall due to : No Fall Risks No Fall Risks  Follow up Falls evaluation completed Falls evaluation completed    FALL RISK PREVENTION PERTAINING TO THE HOME:  Any stairs in or around the home? No  If so, are there any without handrails? No  Home free of loose throw rugs in walkways, pet beds, electrical cords, etc? Yes  Adequate lighting in your home to reduce risk of falls? Yes   ASSISTIVE DEVICES UTILIZED TO PREVENT FALLS:  Life alert? No  Use of a cane, walker or w/c? No  Grab bars in the bathroom? No  Shower chair or bench in shower? No  Elevated toilet seat or a handicapped toilet? No    Cognitive Function:        Immunizations Immunization History  Administered Date(s) Administered   Tdap 03/31/2022    TDAP status: Up to date  Flu Vaccine status: Due, Education has been provided regarding the importance of this vaccine. Advised may receive this vaccine at local pharmacy or Health Dept. Aware to provide a copy of the vaccination record if obtained from local pharmacy or Health Dept.  Verbalized acceptance and understanding.  Pneumococcal vaccine status: Due, Education has been provided regarding the importance of this vaccine. Advised may receive this vaccine at local pharmacy or Health Dept. Aware to provide a copy of the vaccination record if obtained from local pharmacy or Health Dept. Verbalized acceptance and understanding.  Covid-19 vaccine status: Completed vaccines  Qualifies for Shingles Vaccine? Yes   Zostavax completed No   Shingrix Completed?: No.    Education has been provided regarding the importance of this vaccine. Patient has been advised to call insurance company to determine out of pocket expense if  they have not yet received this vaccine. Advised may also receive vaccine at local pharmacy or Health Dept. Verbalized acceptance and understanding.  Screening Tests Health Maintenance  Topic Date Due   COVID-19 Vaccine (1) Never done   Zoster Vaccines- Shingrix (1 of 2) Never done   Pneumonia Vaccine 98+ Years old (1 - PCV) Never done   INFLUENZA VACCINE  05/14/2022   Fecal DNA (Cologuard)  03/07/2025   TETANUS/TDAP  03/31/2032   Hepatitis C Screening  Completed   HPV VACCINES  Aged Out    Health Maintenance  Health Maintenance Due  Topic Date Due   COVID-19 Vaccine (1) Never done   Zoster Vaccines- Shingrix (1 of 2) Never done   Pneumonia Vaccine 60+ Years old (1 - PCV) Never done   INFLUENZA VACCINE  05/14/2022    Colorectal cancer screening: Type of screening: Cologuard. Completed 2023. Repeat every 3 years  Lung Cancer Screening: (Low Dose CT Chest recommended if Age 67-80 years, 30 pack-year currently smoking OR have quit w/in 15years.) does not qualify.   Lung Cancer Screening Referral: na   Additional Screening:  Hepatitis C Screening: does not qualify; Completed   Vision Screening: Recommended annual ophthalmology exams for early detection of glaucoma and other disorders of the eye. Is the patient up to date with their annual eye  exam?  Yes  Who is the provider or what is the name of the office in which the patient attends annual eye exams? [facility]  If pt is not established with a provider, would they like to be referred to a provider to establish care? No .   Dental Screening: Recommended annual dental exams for proper oral hygiene  Community Resource Referral / Chronic Care Management: CRR required this visit?  No   CCM required this visit?  No      Plan:     I have personally reviewed and noted the following in the patient's chart:   Medical and social history Use of alcohol, tobacco or illicit drugs  Current medications and supplements including opioid prescriptions. Patient is not currently taking opioid prescriptions. Functional ability and status Nutritional status Physical activity Advanced directives List of other physicians Hospitalizations, surgeries, and ER visits in previous 12 months Vitals Screenings to include cognitive, depression, and falls Referrals and appointments  In addition, I have reviewed and discussed with patient certain preventive protocols, quality metrics, and best practice recommendations. A written personalized care plan for preventive services as well as general preventive health recommendations were provided to patient.     Abner Greenspan, LPN   1/61/0960   Nurse Notes: Schedule your next wellness visit for 1 year at checkout

## 2022-06-10 NOTE — Patient Instructions (Signed)
  Troy Allen , Thank you for taking time to come for your Medicare Wellness Visit. I appreciate your ongoing commitment to your health goals. Please review the following plan we discussed and let me know if I can assist you in the future.   These are the goals we discussed:   Goals      Have 3 meals a day     Prevent falls        This is a list of the screening recommended for you and due dates:  Health Maintenance  Topic Date Due   COVID-19 Vaccine (1) Never done   Zoster (Shingles) Vaccine (1 of 2) Never done   Pneumonia Vaccine (1 - PCV) Never done   Flu Shot  05/14/2022   Cologuard (Stool DNA test)  03/07/2025   Tetanus Vaccine  03/31/2032   Hepatitis C Screening: USPSTF Recommendation to screen - Ages 18-79 yo.  Completed   HPV Vaccine  Aged Out

## 2022-06-11 NOTE — Progress Notes (Signed)
Please inform the patient that his LDL is elevated. I want his LDL to be <100. I recommend low carbs and fat diet

## 2022-06-14 ENCOUNTER — Telehealth: Payer: Self-pay

## 2022-06-14 NOTE — Telephone Encounter (Signed)
Please advise 

## 2022-06-14 NOTE — Telephone Encounter (Signed)
Marchelle Folks called from [facility] asked if could do the capsules instead of tablets, the tablets are not covered by his insurance.   Call back # doctor line at pharmacy: [phone]  Venlafaxine HCl 37.5 MG TB24

## 2022-06-14 NOTE — Telephone Encounter (Signed)
Could you please sent the capsules

## 2022-06-14 NOTE — Telephone Encounter (Signed)
Spoke with pharmacy gave ok

## 2022-06-20 ENCOUNTER — Telehealth (HOSPITAL_COMMUNITY): Payer: PPO | Admitting: Psychiatry

## 2022-07-02 ENCOUNTER — Other Ambulatory Visit: Payer: Self-pay | Admitting: Family Medicine

## 2022-07-02 DIAGNOSIS — F908 Attention-deficit hyperactivity disorder, other type: Secondary | ICD-10-CM

## 2022-07-05 ENCOUNTER — Telehealth: Payer: Self-pay

## 2022-07-05 NOTE — Telephone Encounter (Signed)
Pt states he is scheduled in [place] in the same network on 10/03, is needed medication to cover until he gets in to see them and is going out of town needs medication sent to pharmacy.

## 2022-07-05 NOTE — Telephone Encounter (Signed)
Please ask the patient  to provide [place] with a psychiatrist who is in his network

## 2022-07-05 NOTE — Telephone Encounter (Signed)
A referral was placed to psychiatry. Can you please follow up on the referral?

## 2022-07-05 NOTE — Telephone Encounter (Signed)
Pt cancelled appt with referring office, said they were not in network with his insurance

## 2022-07-05 NOTE — Telephone Encounter (Signed)
Patient called back in regard to previous tele.   Patient cannot get appt with psychiatrist until 10/3.  Wants to have med refilled until then. Also wants a call back in regard.

## 2022-07-05 NOTE — Telephone Encounter (Signed)
Patient called need med refill ASAP patient is going out of town.  amphetamine-dextroamphetamine (ADDERALL) 20 MG tablet   Pharmacy: [facility].

## 2022-07-10 ENCOUNTER — Other Ambulatory Visit: Payer: Self-pay | Admitting: Family Medicine

## 2022-07-10 DIAGNOSIS — F988 Other specified behavioral and emotional disorders with onset usually occurring in childhood and adolescence: Secondary | ICD-10-CM

## 2022-07-10 DIAGNOSIS — F908 Attention-deficit hyperactivity disorder, other type: Secondary | ICD-10-CM

## 2022-07-10 MED ORDER — AMPHETAMINE-DEXTROAMPHETAMINE 20 MG PO TABS: 20.0000 mg | ORAL_TABLET | Freq: Two times a day (BID) | ORAL | 0 refills | Status: DC

## 2022-07-10 NOTE — Telephone Encounter (Signed)
A short supply of Amphetamine-Dextroamphetamine has been sent to the pharmacy, please encourage patient to follow up with psychiatry

## 2022-07-10 NOTE — Telephone Encounter (Signed)
Pt states he is frustrated due to him being out of town now, would like for this rx to be cancelled and would like an rx sent to [place] when he arrives there he will call the office with name of pharmacy is this okay?

## 2022-07-12 ENCOUNTER — Other Ambulatory Visit: Payer: Self-pay | Admitting: Family Medicine

## 2022-07-12 ENCOUNTER — Other Ambulatory Visit: Payer: Self-pay

## 2022-07-12 ENCOUNTER — Telehealth: Payer: Self-pay

## 2022-07-12 DIAGNOSIS — F908 Attention-deficit hyperactivity disorder, other type: Secondary | ICD-10-CM

## 2022-07-12 MED ORDER — AMPHETAMINE-DEXTROAMPHETAMINE 20 MG PO TABS: 20.0000 mg | ORAL_TABLET | Freq: Two times a day (BID) | ORAL | 0 refills | Status: DC

## 2022-07-12 NOTE — Telephone Encounter (Signed)
Patient called left voice mail during lunch hours needs his prescription transferred to the   [place] [address]  This needs to happen today since he will be there only tonight and tomorrow night  Patient # [phone]  amphetamine-dextroamphetamine (ADDERALL) 10 MG tablet   amphetamine-dextroamphetamine (ADDERALL) 20 MG tablet

## 2022-07-12 NOTE — Telephone Encounter (Signed)
Sent!

## 2022-07-12 NOTE — Telephone Encounter (Signed)
Tried to send the rx to pharmacy given, got a pop up box saying I do not have authority to send this, pharmacy is updated in his chart can you please send?

## 2022-07-12 NOTE — Telephone Encounter (Signed)
Upon calling pt to inform him of refill, pt states behavioral health doctor cancelled the appointment he had coming up and is now battling with them to get him in sooner since all he has is the short supply sent, pt could not tell me the name of the provider said appt was within [place] in [place], I looked at his upcoming, past, and cancelled appts there is nothing that was scheduled for behavioral health in his chart.

## 2022-07-22 ENCOUNTER — Ambulatory Visit (HOSPITAL_COMMUNITY): Payer: PPO | Admitting: Student in an Organized Health Care Education/Training Program

## 2022-07-30 ENCOUNTER — Telehealth: Payer: Self-pay | Admitting: Family Medicine

## 2022-07-30 ENCOUNTER — Other Ambulatory Visit: Payer: Self-pay | Admitting: Family Medicine

## 2022-07-30 DIAGNOSIS — F908 Attention-deficit hyperactivity disorder, other type: Secondary | ICD-10-CM

## 2022-07-30 NOTE — Telephone Encounter (Signed)
Left a vm letting pt know [facility] will not be filling medication he is welcome to see Dr. Doren Custard, lmtrc if any questions.

## 2022-07-30 NOTE — Telephone Encounter (Signed)
Please inform the patient that I am unable to refill Adderall 20 mg.

## 2022-07-30 NOTE — Telephone Encounter (Signed)
Pt called stating he is completely out of amphetamine-dextroamphetamine (ADDERALL) 20 MG tablet. States he had sent a Pharmacist, community request. States he is tired of doing this every couple weeks and this is not working. States he is unable to get an appt with behavioral health. Wants to know if you can please refill?       (States if we are unable to refill he will be changing drs)

## 2022-08-13 ENCOUNTER — Ambulatory Visit (HOSPITAL_BASED_OUTPATIENT_CLINIC_OR_DEPARTMENT_OTHER): Payer: PPO | Admitting: Psychiatry

## 2022-08-13 ENCOUNTER — Encounter (HOSPITAL_COMMUNITY): Payer: Self-pay | Admitting: Psychiatry

## 2022-08-13 DIAGNOSIS — E063 Autoimmune thyroiditis: Secondary | ICD-10-CM | POA: Diagnosis not present

## 2022-08-13 DIAGNOSIS — F321 Major depressive disorder, single episode, moderate: Secondary | ICD-10-CM | POA: Diagnosis not present

## 2022-08-13 MED ORDER — VENLAFAXINE HCL ER 37.5 MG PO TB24: 1.0000 | ORAL_TABLET | Freq: Every day | ORAL | 2 refills | Status: DC

## 2022-08-13 MED ORDER — BUPROPION HCL ER (XL) 150 MG PO TB24: 150.0000 mg | ORAL_TABLET | ORAL | 2 refills | Status: DC

## 2022-08-13 NOTE — Progress Notes (Signed)
Psychiatric Initial Adult Assessment   Patient Identification: Troy Allen MRN:  [id number] Date of Evaluation:  08/13/2022 Referral Source: PCP Chief Complaint:   Chief Complaint  Patient presents with   Establish Care   Depression   ADD   Visit Diagnosis:    ICD-10-CM   1. Current moderate episode of major depressive disorder, unspecified whether recurrent (HCC)  F32.1     2. Hashimoto's disease  E06.3        Assessment:  Troy Allen is a 66 y.o. y.o. male with a history of depression, hashimoto's disease, and reported ADD who presents virtually to [facility] for initial evaluation on 08/13/2022.  Patient reports a history of depression and ADHD though the depression is currently well controlled.  He reports experiencing symptoms of fatigue, low mood, amotivation, and anhedonia during periods of depression.  He has had 1 instance of passive SI with no plan or intent over 20 years ago.  He denies any SI/HI or thoughts of self-harm at this time along with any history of mania, psychosis, paranoia, or delusions.  Patient also endorses symptoms of poor concentration, increased sluggishness, fatigue, and impaired memory.  Patient feels these symptoms have improved when he has taken Adderall in the past.  Of note patient has a past history of Hashimoto's disease which she is on Synthroid and TSH is currently within normal limits.  Patient meets criteria for MDD currently stable and further clarity via neuropsych testing is needed to rule out ADHD.  We will start bupropion as an adjunct to the venlafaxine for mood and concentration symptoms.  Risks and benefits were discussed.  A number of assessments were performed during the evaluation today including nutritional assessment which was 0, pain assessment which showed no pain, PHQ-9 which they scored a 0 on, GAD-7 which they scored a 6 on, and [place] suicide severity screening which showed no  risk.  Based on these assessments patient would benefit from medication adjustment to better target their symptoms.  Plan: - Continue Venlafaxine 37.5 mg QD - Start Bupropion XL 150 mg - Discontinue Adderall 30 mg BID - CMP, CBC, lipid profile, vitamin D, TSH, and free T4 reviewed - Neuropsych testing referral - Crisis resources reviewed - Follow up in a month  History of Present Illness: She also presents reporting that the main reason he has come to establish a psychiatric provider.  He reports that he has a diagnosis of depression and ADHD and has had difficulty finding treatment for his ADHD over the last year.  On exploration of patient's past psychiatric history he notes that he started seeing a therapist in [place] around 1995 for depression and then later was connected with a psychiatrist.  He had tried a number of medication before finally noticing benefit from Effexor.  These include Prozac which she felt did not help and bupropion which was only mildly effective.  Due to patient not having insurance for a period he discontinued his medications and notes experiencing severe withdrawal off the Effexor.  It was until several years later that he was able to get insurance again and reconnect with a new psychiatric provider.  At this point he connect with provider who restarted the Effexor and diagnosed the patient with ADHD starting Adderall.  Patient believes this was around 8 to 10 years ago.  Around 3 to 4 years ago patient notes that he opted to move back to the [place] area and connected with his primary  Dr. Sudie Bailey who manages his psychiatric medications for a couple years  Due to issues with his PCP's office patient opted to change to a new provider however this provider was not open to continuing the Adderall.  Due to this patient has reached out for psychiatric establishment.  We discussed patient's symptoms and he reports that his depression is currently stable with the only  occasional need to cry for no apparent reason which happens rarely.  In the past he experienced periods of no motivation, anhedonia, and fatigue with the one instance of passive suicidal ideation in 1995.  Patient notes his last episode of more severe depression was in 2020.  As for his ADHD symptoms patient describes it as feeling like he struggles to get anything accomplished.  He reports putting 3 times the effort to get the same amount accomplished compared to when he is on stimulants versus when he is not.  In addition when he is not taking stimulants he finds himself getting tired and struggling to stay awake to the point that he had stopped driving at one point.  Patient will often increase his caffeine use when he does not have stimulants.  Patient reports he has never had a sleep study.  Went over treatment options including medications and behavioral modifications.  Patient notes that he feels the Effexor is at a good dose and has tried higher doses but felt they just made him feel more numb.  He also notes having attempted to try the stimulants in the past but never had them approved by insurance.  We explained that neuropsych testing is needed to start was on a stimulant while patient is frustrated he was open to doing this.  We also explored alternative options to help with ADHD such as bupropion which patient was open to retrying.  We also discussed the importance of discontinuing his marijuana usage as it can negatively impact memory and concentration symptoms.  Of note patient reports he has Hashimoto's disease for which she takes Synthroid.  While his TSH levels have remained stable he still feels like he has low energy and poor concentration and would like to connect with an endocrinologist who will do a further workup.  Associated Signs/Symptoms: Depression Symptoms:  fatigue, difficulty concentrating, impaired memory, anxiety, loss of energy/fatigue, (Hypo) Manic Symptoms:    Denies Anxiety Symptoms:  Excessive Worry, Psychotic Symptoms:   Denies PTSD Symptoms: NA  Past Psychiatric History: Patient's psychiatric care started in 1995 first with therapy than medication.  He reports trying a number of medications but only remembers Prozac, bupropion, and Effexor in the antidepressant realm.  He has also only tried Adderall as far stimulants ago.  He denies any prior suicide attempts or psychiatric hospitalizations.  Patient reports having half a glass of wine with dinner and using pot occasionally around once or twice a week.  He notes that he does smoke nicotine but wants to quit.  Previous Psychotropic Medications: Yes   Substance Abuse History in the last 12 months:  Yes.    Consequences of Substance Abuse: NA  Past Medical History:  Past Medical History:  Diagnosis Date   Hashimoto's disease     Past Surgical History:  Procedure Laterality Date   HAND TENDON SURGERY Left     Family Psychiatric History: His brother had depression and committed suicide  Family History: No family history on file.  Social History:   Social History   Socioeconomic History   Marital status: Divorced    Spouse  name: Not on file   Number of children: Not on file   Years of education: Not on file   Highest education level: Not on file  Occupational History   Not on file  Tobacco Use   Smoking status: Never    Passive exposure: Never   Smokeless tobacco: Never  Vaping Use   Vaping Use: Every day  Substance and Sexual Activity   Alcohol use: Yes    Comment: occasionally   Drug use: Yes    Types: Marijuana   Sexual activity: Yes  Other Topics Concern   Not on file  Social History Narrative   Not on file   Social Determinants of Health   Financial Resource Strain: Not on file  Food Insecurity: Not on file  Transportation Needs: No Transportation Needs (06/10/2022)   PRAPARE - Transportation    Lack of Transportation (Medical): No    Lack of  Transportation (Non-Medical): No  Physical Activity: Sufficiently Active (06/10/2022)   Exercise Vital Sign    Days of Exercise per Week: 5 days    Minutes of Exercise per Session: 60 min  Stress: Not on file  Social Connections: Not on file    Additional Social History: Patient is retired on [employer] farm [employer] and currently lives in a camper although he rents a house from his brother.  He has a girlfriend who also lives in a camper and the 2 are planning to move on to a plot of land together.  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    Allergies:  No Known Allergies  Metabolic Disorder Labs: Lab Results  Component Value Date   HGBA1C 5.5 06/06/2022   No results found for: "PROLACTIN" Lab Results  Component Value Date   CHOL 189 06/06/2022   TRIG 111 06/06/2022   HDL 41 06/06/2022   CHOLHDL 4.6 06/06/2022   LDLCALC 128 (H) 06/06/2022   Lab Results  Component Value Date   TSH 0.952 06/06/2022    Therapeutic Level Labs: No results found for: "LITHIUM" No results found for: "CBMZ" No results found for: "VALPROATE"  Current Medications: Current Outpatient Medications  Medication Sig Dispense Refill   amphetamine-dextroamphetamine (ADDERALL) 10 MG tablet Take 3 tablets (30 mg total) by mouth 2 (two) times daily. 51 tablet 0   amphetamine-dextroamphetamine (ADDERALL) 20 MG tablet Take 1 tablet (20 mg total) by mouth 2 (two) times daily. 15 tablet 0   levothyroxine (SYNTHROID) 137 MCG tablet Take 1 tablet (137 mcg total) by mouth daily before breakfast. 30 tablet 2   Venlafaxine HCl 37.5 MG TB24 Take 1 tablet (37.5 mg total) by mouth daily. 30 tablet 2   No current facility-administered medications for this visit.    Psychiatric Specialty Exam: Review of Systems  There were no vitals taken for this visit.There is no height or weight on file to calculate BMI.  General Appearance: Well Groomed  Eye Contact:  Good  Speech:  Clear and Coherent  Volume:   Normal  Mood:  Euthymic and Irritable  Affect:  Congruent  Thought Process:  Coherent, Goal Directed, and Linear  Orientation:  Full (Time, Place, and Person)  Thought Content:  Logical  Suicidal Thoughts:  No  Homicidal Thoughts:  No  Memory:  Immediate;   Good  Judgement:  Fair  Insight:  Fair  Psychomotor Activity:  Normal  Concentration:  Concentration: Good  Recall:  Good  Fund of Knowledge:Good  Language: Good  Akathisia:  NA  AIMS (if indicated):  not done  Assets:  Communication Skills Intimacy Physical Health Resilience Social Support Talents/Skills  ADL's:  Intact  Cognition: WNL  Sleep:  Good   Screenings: PHQ2-9    Flowsheet Row Office Visit from 06/05/2022 in [facility] Office Visit from 02/25/2022 in [facility]  PHQ-2 Total Score 0 2  PHQ-9 Total Score -- 3      Flowsheet Row ED from 03/31/2022 in [facility] EMERGENCY DEPARTMENT  C-SSRS RISK CATEGORY No Risk        Collaboration of Care: Medication Management AEB medication prescription and Primary Care Provider AEB chart review  Patient/Guardian was advised Release of Information must be obtained prior to any record release in order to collaborate their care with an outside provider. Patient/Guardian was advised if they have not already done so to contact the registration department to sign all necessary forms in order for [place] to release information regarding their care.   Consent: Patient/Guardian gives verbal consent for treatment and assignment of benefits for services provided during this visit. Patient/Guardian expressed understanding and agreed to proceed.   Stasia Cavalier, MD 10/31/202310:46 AM    Virtual Visit via Video Note  I connected with Angus Seller on 08/13/22 at  3:00 PM EDT by a video enabled telemedicine application and verified that I am speaking with the correct person using two identifiers.  Location: Patient: Home Provider: Home office   I  discussed the limitations of evaluation and management by telemedicine and the availability of in person appointments. The patient expressed understanding and agreed to proceed.   I discussed the assessment and treatment plan with the patient. The patient was provided an opportunity to ask questions and all were answered. The patient agreed with the plan and demonstrated an understanding of the instructions.   The patient was advised to call back or seek an in-person evaluation if the symptoms worsen or if the condition fails to improve as anticipated.  I provided 60 minutes of non-face-to-face time during this encounter.   Stasia Cavalier, MD

## 2022-08-16 ENCOUNTER — Telehealth (HOSPITAL_COMMUNITY): Payer: Self-pay | Admitting: *Deleted

## 2022-08-16 NOTE — Telephone Encounter (Signed)
PA FOR VENLAFAXINE ER 37.5 MG TABS SUBMITTED TO PT INSURANCE VIA COVER MY MEDS.  AWAITING DETERMINATION.

## 2022-08-16 NOTE — Telephone Encounter (Signed)
PA FOR EFFEXOR XR 37.5 MG APPROVED BY [employer] FROM 08/16/2022 THROUGH 10/14/23.

## 2022-08-21 ENCOUNTER — Telehealth (HOSPITAL_COMMUNITY): Payer: Self-pay | Admitting: *Deleted

## 2022-08-21 NOTE — Telephone Encounter (Signed)
Writer spoke with pt to advise that Neuropsych testing referrals have been made to [facility] and [facility]. Writer advised pt that both offices have a wait of at least 3 months for testing. Pt verbalizes understanding.

## 2022-09-02 ENCOUNTER — Telehealth (HOSPITAL_COMMUNITY): Payer: Self-pay

## 2022-09-02 DIAGNOSIS — F321 Major depressive disorder, single episode, moderate: Secondary | ICD-10-CM

## 2022-09-02 MED ORDER — VENLAFAXINE HCL ER 37.5 MG PO CP24: 37.5000 mg | ORAL_CAPSULE | Freq: Every day | ORAL | 2 refills | Status: DC

## 2022-09-02 NOTE — Telephone Encounter (Signed)
Medication problem - Call from pharmacist at [facility] in [place] requesting to change pt's prescribed order for Venlafaxine HCL 37.5 mg tablets to capsules as pt's insurance will not cover the tablets and cost would be $385.  Collateral requests permission to change this or for a new prescription to be sent if change appropriate.

## 2022-09-02 NOTE — Telephone Encounter (Signed)
Medication management - Telephone call with [employer], pharmacy tech at the [facility] in [place] to make sure they had received Dr. Mercy Riding new order for Venlafaxine XR 37.5 mg capsules to fill. Collateral verified and they will call back if anything else needed for patient's insurance to cover medication.

## 2022-09-04 ENCOUNTER — Encounter: Payer: Self-pay | Admitting: Family Medicine

## 2022-09-04 ENCOUNTER — Ambulatory Visit (INDEPENDENT_AMBULATORY_CARE_PROVIDER_SITE_OTHER): Payer: PPO | Admitting: Family Medicine

## 2022-09-04 VITALS — BP 122/73 | HR 72 | Ht 68.0 in | Wt 161.0 lb

## 2022-09-04 DIAGNOSIS — N528 Other male erectile dysfunction: Secondary | ICD-10-CM | POA: Diagnosis not present

## 2022-09-04 DIAGNOSIS — F321 Major depressive disorder, single episode, moderate: Secondary | ICD-10-CM

## 2022-09-04 DIAGNOSIS — E559 Vitamin D deficiency, unspecified: Secondary | ICD-10-CM

## 2022-09-04 DIAGNOSIS — R7301 Impaired fasting glucose: Secondary | ICD-10-CM | POA: Diagnosis not present

## 2022-09-04 DIAGNOSIS — N529 Male erectile dysfunction, unspecified: Secondary | ICD-10-CM | POA: Insufficient documentation

## 2022-09-04 DIAGNOSIS — Z23 Encounter for immunization: Secondary | ICD-10-CM | POA: Diagnosis not present

## 2022-09-04 DIAGNOSIS — E7849 Other hyperlipidemia: Secondary | ICD-10-CM

## 2022-09-04 DIAGNOSIS — E063 Autoimmune thyroiditis: Secondary | ICD-10-CM

## 2022-09-04 MED ORDER — SILDENAFIL CITRATE 25 MG PO TABS: 25.0000 mg | ORAL_TABLET | Freq: Every day | ORAL | 0 refills | Status: DC | PRN

## 2022-09-04 NOTE — Assessment & Plan Note (Signed)
He denies suicidal and homicidal ideation Encouraged to follow up with Dr. Mercy Riding as scheduled Encouraged to continue taking wellbutrin XL 150 mg daily and Venlafaxine 37.5 mg daily

## 2022-09-04 NOTE — Patient Instructions (Addendum)
I appreciate the opportunity to provide care to you today!    Follow up:  4 months  Labs: please stop by the lab during the week to get your blood drawn (CBC, CMP, TSH, Lipid profile, HgA1c, Vit D)  Please pick up your medication at the pharmacy  Please start taking sildenafil (Viagra) 50 mg as needed for erectile dysfunction Please take sildenafil 1 hour prior to sexual activity, however it can be taken 30 minutes to 4 hours prior to sexual activity Do not take more than 1 dose per day May take with food or without food; avoid taking with high fat meals                             HAPPY HOLIDAYS  Please continue to a heart-healthy diet and increase your physical activities. Try to exercise for at least three times a week.      It was a pleasure to see you and I look forward to continuing to work together on your health and well-being. Please do not hesitate to call the office if you need care or have questions about your care.   Have a wonderful day and week. With Gratitude, Gilmore Laroche MSN, FNP-BC

## 2022-09-04 NOTE — Progress Notes (Signed)
Established Patient Office Visit  Subjective:  Patient ID: Troy Allen, male    DOB: [birth year]  Age: 66 y.o. MRN: [id number]  CC:  Chief Complaint  Patient presents with   Follow-up    3 month f/u     HPI Troy Allen is a 66 y.o. male with past medical history of depression, ADD, Hashimoto's disease presents for f/u of  chronic medical conditions.  Depression: He followed up with Dr. Nelida Gores on 08/13/2022 and was started on Wellbutrin XL 150 mg daily in addition to Venlafaxine 37.5 mg daily for his depression.  He was referred for neuropsych testing to [facility] and [facility] to rule out ADHD, and his Adderall 30 mg twice daily was discontinued.    He reports compliance with the treatment regimen, noting that he is feeling better. He denies fatigue, suicidal, and homicidal ideation.   Erectile dysfunction: He complains of inability to sustain the erection and would like to be started on pharmacological treatment today  Past Medical History:  Diagnosis Date   Hashimoto's disease     Past Surgical History:  Procedure Laterality Date   HAND TENDON SURGERY Left     History reviewed. No pertinent family history.  Social History   Socioeconomic History   Marital status: Divorced    Spouse name: Not on file   Number of children: Not on file   Years of education: Not on file   Highest education level: Not on file  Occupational History   Not on file  Tobacco Use   Smoking status: Never    Passive exposure: Never   Smokeless tobacco: Never  Vaping Use   Vaping Use: Every day  Substance and Sexual Activity   Alcohol use: Yes    Comment: occasionally   Drug use: Yes    Types: Marijuana   Sexual activity: Yes  Other Topics Concern   Not on file  Social History Narrative   Not on file   Social Determinants of Health   Financial Resource Strain: Not on file  Food Insecurity: Not on file  Transportation Needs: No  Transportation Needs (06/10/2022)   PRAPARE - Transportation    Lack of Transportation (Medical): No    Lack of Transportation (Non-Medical): No  Physical Activity: Sufficiently Active (06/10/2022)   Exercise Vital Sign    Days of Exercise per Week: 5 days    Minutes of Exercise per Session: 60 min  Stress: Not on file  Social Connections: Not on file  Intimate Partner Violence: Not on file    Outpatient Medications Prior to Visit  Medication Sig Dispense Refill   buPROPion (WELLBUTRIN XL) 150 MG 24 hr tablet Take 1 tablet (150 mg total) by mouth every morning. 30 tablet 2   levothyroxine (SYNTHROID) 137 MCG tablet Take 1 tablet (137 mcg total) by mouth daily before breakfast. 30 tablet 2   venlafaxine XR (EFFEXOR-XR) 37.5 MG 24 hr capsule Take 1 capsule (37.5 mg total) by mouth daily. 30 capsule 2   amphetamine-dextroamphetamine (ADDERALL) 10 MG tablet Take 3 tablets (30 mg total) by mouth 2 (two) times daily. (Patient not taking: Reported on 09/04/2022) 51 tablet 0   amphetamine-dextroamphetamine (ADDERALL) 20 MG tablet Take 1 tablet (20 mg total) by mouth 2 (two) times daily. (Patient not taking: Reported on 09/04/2022) 15 tablet 0   No facility-administered medications prior to visit.    No Known Allergies  ROS Review of Systems  Constitutional:  Negative for fatigue and fever.  Eyes:  Negative for visual disturbance.  Respiratory:  Negative for chest tightness and shortness of breath.   Cardiovascular:  Negative for chest pain and palpitations.  Neurological:  Negative for dizziness.      Objective:    Physical Exam HENT:     Head: Normocephalic.     Right Ear: External ear normal.     Left Ear: External ear normal.  Cardiovascular:     Rate and Rhythm: Normal rate and regular rhythm.     Pulses: Normal pulses.     Heart sounds: Normal heart sounds.  Pulmonary:     Effort: Pulmonary effort is normal.     Breath sounds: Normal breath sounds.  Neurological:      Mental Status: He is alert.     BP 122/73   Pulse 72   Ht _0  (1.727 m)   Wt 161 lb 0.6 oz (73 kg)   SpO2 96%   BMI 24.49 kg/m  Wt Readings from Last 3 Encounters:  09/04/22 161 lb 0.6 oz (73 kg)  06/05/22 158 lb 1.9 oz (71.7 kg)  03/31/22 150 lb (68 kg)    Lab Results  Component Value Date   TSH 0.952 06/06/2022   Lab Results  Component Value Date   WBC 6.6 06/06/2022   HGB 13.6 06/06/2022   HCT 40.6 06/06/2022   MCV 99 (H) 06/06/2022   PLT 274 06/06/2022   Lab Results  Component Value Date   NA 138 06/06/2022   K 4.7 06/06/2022   CO2 26 06/06/2022   GLUCOSE 84 06/06/2022   BUN 13 06/06/2022   CREATININE 0.87 06/06/2022   BILITOT 0.3 06/06/2022   ALKPHOS 65 06/06/2022   AST 19 06/06/2022   ALT 17 06/06/2022   PROT 6.6 06/06/2022   ALBUMIN 4.1 06/06/2022   CALCIUM 9.2 06/06/2022   EGFR 95 06/06/2022   Lab Results  Component Value Date   CHOL 189 06/06/2022   Lab Results  Component Value Date   HDL 41 06/06/2022   Lab Results  Component Value Date   LDLCALC 128 (H) 06/06/2022   Lab Results  Component Value Date   TRIG 111 06/06/2022   Lab Results  Component Value Date   CHOLHDL 4.6 06/06/2022   Lab Results  Component Value Date   HGBA1C 5.5 06/06/2022      Assessment & Plan:  Other male erectile dysfunction Assessment & Plan: We will start patient on Viagra 25 mg as needed Encouraged not to take more than 1 dose daily Encouraged to take sildenafil 1 hour prior to sexual activity, however it can be taken 30 minutes to 4 hours prior to sexual activity   Orders: -     Sildenafil Citrate; Take 1 tablet (25 mg total) by mouth daily as needed for erectile dysfunction. Erectile dysfunction: do not take >1 dose per day  Dispense: 15 tablet; Refill: 0  Current moderate episode of major depressive disorder, unspecified whether recurrent ([place]) Assessment & Plan: He denies suicidal and homicidal ideation Encouraged to follow up with Dr.  Nelida Gores as scheduled Encouraged to continue taking wellbutrin XL 150 mg daily and Venlafaxine 37.5 mg daily   Need for immunization against influenza Assessment & Plan: Patient educated on CDC recommendation for the vaccine. Verbal consent was obtained from the patient, vaccine administered by nurse, no sign of adverse reactions noted at this time. Patient education on arm soreness and use of tylenol or ibuprofen for this patient  was discussed. Patient educated  on the signs and symptoms of adverse effect and advise to contact the office if they occur.    Flu vaccine need -     Flu Vaccine QUAD High Dose(Fluad)  Immunization due -     Pneumococcal conjugate vaccine 20-valent  IFG (impaired fasting glucose) -     Hemoglobin A1c  Vitamin D deficiency -     VITAMIN D 25 Hydroxy (Vit-D Deficiency, Fractures)  Hashimoto's disease -     TSH + free T4 -     CMP14+EGFR -     CBC with Differential/Platelet  Other hyperlipidemia -     Lipid panel    Follow-up: Return in about 4 years (around 09/04/2026).   Alvira Monday, FNP

## 2022-09-04 NOTE — Assessment & Plan Note (Signed)
Patient educated on CDC recommendation for the vaccine. Verbal consent was obtained from the patient, vaccine administered by nurse, no sign of adverse reactions noted at this time. Patient education on arm soreness and use of tylenol or ibuprofen for this patient  was discussed. Patient educated on the signs and symptoms of adverse effect and advise to contact the office if they occur.  

## 2022-09-04 NOTE — Assessment & Plan Note (Signed)
We will start patient on Viagra 25 mg as needed Encouraged not to take more than 1 dose daily Encouraged to take sildenafil 1 hour prior to sexual activity, however it can be taken 30 minutes to 4 hours prior to sexual activity

## 2022-09-12 ENCOUNTER — Other Ambulatory Visit: Payer: Self-pay | Admitting: Family Medicine

## 2022-09-12 ENCOUNTER — Other Ambulatory Visit: Payer: Self-pay

## 2022-09-12 DIAGNOSIS — E7849 Other hyperlipidemia: Secondary | ICD-10-CM

## 2022-09-12 MED ORDER — ROSUVASTATIN CALCIUM 10 MG PO TABS: 10.0000 mg | ORAL_TABLET | Freq: Every day | ORAL | 3 refills | Status: DC

## 2022-09-12 NOTE — Progress Notes (Addendum)
A prescription for rosuvastatin 10 mg is sent to the pharmacy.  Please inform the patient that his cholesterol levels are elevated.  I recommend a low-carb and fat diet with increased physical activity.  All other labs are stable.

## 2022-09-13 LAB — LIPID PANEL
Chol/HDL Ratio: 5.3 ratio — ABNORMAL HIGH (ref 0.0–5.0)
Cholesterol, Total: 216 mg/dL — ABNORMAL HIGH (ref 100–199)
HDL: 41 mg/dL (ref >39)
LDL Chol Calc (NIH): 155 mg/dL — ABNORMAL HIGH (ref 0–99)
Triglycerides: 111 mg/dL (ref 0–149)
VLDL Cholesterol Cal: 20 mg/dL (ref 5–40)

## 2022-09-13 LAB — CMP14+EGFR
ALT: 16 IU/L (ref 0–44)
AST: 19 IU/L (ref 0–40)
Albumin/Globulin Ratio: 1.7 (ref 1.2–2.2)
Albumin: 4.5 g/dL (ref 3.9–4.9)
Alkaline Phosphatase: 65 IU/L (ref 44–121)
BUN/Creatinine Ratio: 15 (ref 10–24)
BUN: 14 mg/dL (ref 8–27)
Bilirubin Total: 0.4 mg/dL (ref 0.0–1.2)
CO2: 26 mmol/L (ref 20–29)
Calcium: 9.5 mg/dL (ref 8.6–10.2)
Chloride: 102 mmol/L (ref 96–106)
Creatinine, Ser: 0.95 mg/dL (ref 0.76–1.27)
Globulin, Total: 2.7 g/dL (ref 1.5–4.5)
Glucose: 89 mg/dL (ref 70–99)
Potassium: 4.9 mmol/L (ref 3.5–5.2)
Sodium: 139 mmol/L (ref 134–144)
Total Protein: 7.2 g/dL (ref 6.0–8.5)
eGFR: 88 mL/min/{1.73_m2} (ref >59)

## 2022-09-13 LAB — CBC WITH DIFFERENTIAL/PLATELET
Basophils Absolute: 0.1 10*3/uL (ref 0.0–0.2)
Basos: 1 %
EOS (ABSOLUTE): 0.2 10*3/uL (ref 0.0–0.4)
Eos: 3 %
Hematocrit: 42.2 % (ref 37.5–51.0)
Hemoglobin: 14.7 g/dL (ref 13.0–17.7)
Immature Grans (Abs): 0 10*3/uL (ref 0.0–0.1)
Immature Granulocytes: 0 %
Lymphocytes Absolute: 2.3 10*3/uL (ref 0.7–3.1)
Lymphs: 33 %
MCH: 33.6 pg — ABNORMAL HIGH (ref 26.6–33.0)
MCHC: 34.8 g/dL (ref 31.5–35.7)
MCV: 96 fL (ref 79–97)
Monocytes Absolute: 0.6 10*3/uL (ref 0.1–0.9)
Monocytes: 9 %
Neutrophils Absolute: 3.7 10*3/uL (ref 1.4–7.0)
Neutrophils: 54 %
Platelets: 310 10*3/uL (ref 150–450)
RBC: 4.38 x10E6/uL (ref 4.14–5.80)
RDW: 12.1 % (ref 11.6–15.4)
WBC: 6.9 10*3/uL (ref 3.4–10.8)

## 2022-09-13 LAB — VITAMIN D 25 HYDROXY (VIT D DEFICIENCY, FRACTURES): Vit D, 25-Hydroxy: 37.5 ng/mL (ref 30.0–100.0)

## 2022-09-13 LAB — TSH+FREE T4
Free T4: 1.3 ng/dL (ref 0.82–1.77)
TSH: 2.04 u[IU]/mL (ref 0.450–4.500)

## 2022-09-13 LAB — HEMOGLOBIN A1C
Est. average glucose Bld gHb Est-mCnc: 111 mg/dL
Hgb A1c MFr Bld: 5.5 % (ref 4.8–5.6)

## 2022-09-17 ENCOUNTER — Telehealth (HOSPITAL_COMMUNITY): Payer: PPO | Admitting: Psychiatry

## 2022-09-17 ENCOUNTER — Encounter (HOSPITAL_COMMUNITY): Payer: Self-pay

## 2022-10-01 ENCOUNTER — Other Ambulatory Visit (HOSPITAL_COMMUNITY): Payer: Self-pay | Admitting: *Deleted

## 2022-10-01 DIAGNOSIS — F321 Major depressive disorder, single episode, moderate: Secondary | ICD-10-CM

## 2022-10-01 MED ORDER — BUPROPION HCL ER (XL) 150 MG PO TB24: 150.0000 mg | ORAL_TABLET | ORAL | 0 refills | Status: DC

## 2022-10-04 ENCOUNTER — Encounter: Payer: Self-pay | Admitting: Family Medicine

## 2022-10-04 ENCOUNTER — Telehealth (INDEPENDENT_AMBULATORY_CARE_PROVIDER_SITE_OTHER): Payer: PPO | Admitting: Family Medicine

## 2022-10-04 VITALS — Ht 68.0 in | Wt 160.0 lb

## 2022-10-04 DIAGNOSIS — B349 Viral infection, unspecified: Secondary | ICD-10-CM | POA: Diagnosis not present

## 2022-10-04 DIAGNOSIS — E785 Hyperlipidemia, unspecified: Secondary | ICD-10-CM

## 2022-10-04 MED ORDER — EZETIMIBE 10 MG PO TABS: 10.0000 mg | ORAL_TABLET | Freq: Every day | ORAL | 3 refills | Status: DC

## 2022-10-04 NOTE — Progress Notes (Signed)
Virtual Visit via Telephone Note   This visit type was conducted via telephone. This format is felt to be most appropriate for this patient at this time.  The patient did not have access to video technology/had technical difficulties with video requiring transitioning to audio format only (telephone).  All issues noted in this document were discussed and addressed.  No physical exam could be performed with this format.  Evaluation Performed:  Follow-up visit  Date:  10/04/2022   ID:  Troy Allen, Troy Allen 1956-06-16, MRN [id number]  Patient Location: Home Provider Location: Clinic  Participants: Patient Location of Patient: Home Location of Provider:  Consent was obtain for visit to be over via telehealth. I verified that I am speaking with the correct person using two identifiers.  PCP:  Alvira Monday, FNP   Chief Complaint:    History of Present Illness:    Troy Allen is a 66 y.o. male with complaints of one occurrence of fever, which has resolved.  He reports a slight cough, dizziness, ear fullness, and fatigue.  Onset of symptoms 09/28/22.  He reports feeling overall with a negative COVID test.  He is taking Mucinex over the counter.  Hyperlipidemia: He reports not taking his statins, due to the side effects that he read online about the medication.  He would like to implement lifestyle changes to lower his LDL levels  The patient does not have symptoms concerning for COVID-19 infection (fever, chills, cough, or new shortness of breath).   Past Medical, Surgical, Social History, Allergies, and Medications have been Reviewed.  Past Medical History:  Diagnosis Date   Hashimoto's disease    Past Surgical History:  Procedure Laterality Date   HAND TENDON SURGERY Left      Current Meds  Medication Sig   buPROPion (WELLBUTRIN XL) 150 MG 24 hr tablet Take 1 tablet (150 mg total) by mouth every morning.   ezetimibe (ZETIA) 10 MG tablet Take 1 tablet (10 mg  total) by mouth daily.   levothyroxine (SYNTHROID) 137 MCG tablet Take 1 tablet (137 mcg total) by mouth daily before breakfast.   sildenafil (VIAGRA) 25 MG tablet Take 1 tablet (25 mg total) by mouth daily as needed for erectile dysfunction. Erectile dysfunction: do not take >1 dose per day   venlafaxine XR (EFFEXOR-XR) 37.5 MG 24 hr capsule Take 1 capsule (37.5 mg total) by mouth daily.     Allergies:   Patient has no known allergies.   ROS:   Please see the history of present illness.     All other systems reviewed and are negative.   Labs/Other Tests and Data Reviewed:    Recent Labs: 09/11/2022: ALT 16; BUN 14; Creatinine, Ser 0.95; Hemoglobin 14.7; Platelets 310; Potassium 4.9; Sodium 139; TSH 2.040   Recent Lipid Panel Lab Results  Component Value Date/Time   CHOL 216 (H) 09/11/2022 09:58 AM   TRIG 111 09/11/2022 09:58 AM   HDL 41 09/11/2022 09:58 AM   CHOLHDL 5.3 (H) 09/11/2022 09:58 AM   LDLCALC 155 (H) 09/11/2022 09:58 AM    Wt Readings from Last 3 Encounters:  10/04/22 160 lb (72.6 kg)  09/04/22 161 lb 0.6 oz (73 kg)  06/05/22 158 lb 1.9 oz (71.7 kg)     Objective:    Vital Signs:  Ht 5\' 8"  (1.727 m)   Wt 160 lb (72.6 kg)   BMI 24.33 kg/m      ASSESSMENT & PLAN:   Viral illness He reports  feeling better Encourage patient to continue symptomatic management with over-the-counter treatment Increase fluids and allow for plenty of rest. Recommend Tylenol or ibuprofen as needed for pain, fever, or general discomfort. Recommend using a humidifier at bedtime during sleep to help with cough and nasal congestion. Follow-up if your symptoms do not improve    Hyperlipidemia Will discontinue rosuvastatin Will start patient on acetamide 10 mg daily Encourage to take patient on 2000 mg twice daily Encouraged low carbs and fat diet with increased physical activities  Time:   Today, I have spent 12 minutes reviewing the chart, including problem list, medications,  and with the patient with telehealth technology discussing the above problems.   Medication Adjustments/Labs and Tests Ordered: Current medicines are reviewed at length with the patient today.  Concerns regarding medicines are outlined above.   Tests Ordered: No orders of the defined types were placed in this encounter.   Medication Changes: Meds ordered this encounter  Medications   ezetimibe (ZETIA) 10 MG tablet    Sig: Take 1 tablet (10 mg total) by mouth daily.    Dispense:  90 tablet    Refill:  3     Note: This dictation was prepared with Dragon dictation along with smaller phrase technology. Similar sounding words can be transcribed inadequately or may not be corrected upon review. Any transcriptional errors that result from this process are unintentional.      Disposition:  Follow up  Signed, Gilmore Laroche, FNP  10/04/2022 8:06 PM     [facility]

## 2022-10-21 ENCOUNTER — Encounter (HOSPITAL_COMMUNITY): Payer: Self-pay | Admitting: Psychiatry

## 2022-10-21 ENCOUNTER — Telehealth (HOSPITAL_BASED_OUTPATIENT_CLINIC_OR_DEPARTMENT_OTHER): Payer: PPO | Admitting: Psychiatry

## 2022-10-21 DIAGNOSIS — F321 Major depressive disorder, single episode, moderate: Secondary | ICD-10-CM | POA: Diagnosis not present

## 2022-10-21 DIAGNOSIS — E063 Autoimmune thyroiditis: Secondary | ICD-10-CM

## 2022-10-21 MED ORDER — BUPROPION HCL ER (XL) 300 MG PO TB24: 300.0000 mg | ORAL_TABLET | ORAL | 1 refills | Status: DC

## 2022-10-21 NOTE — Progress Notes (Signed)
BH MD/PA/NP OP Progress Note  10/21/2022 8:46 AM CREG GILMER  MRN:  [id number]  Visit Diagnosis:    ICD-10-CM   1. Current moderate episode of major depressive disorder, unspecified whether recurrent (HCC)  F32.1     2. Hashimoto's disease  E06.3       Assessment:  Troy Allen is a 67 y.o. male with a history of depression, hashimoto's disease, and reported ADD who presented to [facility] for initial evaluation on 08/13/2022.  During initial evaluation patient reported a history of depression and ADHD though the depression has been well controlled.  He reported experiencing symptoms of fatigue, low mood, amotivation, and anhedonia during periods of depression.  He had 1 instance of passive SI with no plan or intent over 20 years ago.  He denied any SI/HI or thoughts of self-harm at this time along with any history of mania, psychosis, paranoia, or delusions.  Patient also endorsed symptoms of poor concentration, increased sluggishness, fatigue, and impaired memory.  Patient felt these symptoms improved when he had taken Adderall in the past.  Of note patient has a past history of Hashimoto's disease which he is on Synthroid and TSH is currently within normal limits.  Patient met criteria for MDD currently stable and further clarity via neuropsych testing is needed to rule out ADHD.     Troy Allen presents for follow-up evaluation. Today, 10/21/22, patient reports some minor improvement in his attention and concentration since starting Wellbutrin.  He denies any adverse side effects from the medication.  Patient reports that his mood remains stable.  Plan: - Continue Venlafaxine 37.5 mg QD - Increase Bupropion XL to 300 mg - Discontinue Adderall 30 mg BID - CMP, CBC, lipid profile, vitamin D, TSH, and free T4 reviewed - Neuropsych testing referral, completing initial paperwork - Crisis resources reviewed - Follow up in a  month    Chief Complaint:  Chief Complaint  Patient presents with   Follow-up   HPI: Patient presents reporting that he is doing all right.  Since starting the bupropion he has not noticed an extreme difference in his overall concentration and focus, however feels like there might be a more subtle improvement.  He also denies experiencing adverse side effects from the medication.  Of note patient reports that he had been experiencing erectile dysfunction ever since he started antidepressants several years ago.  We discussed how the addition of Wellbutrin could lead to improvement in these symptoms.  The patient denies any adverse side effects from medication we will continue to titrate the dose of Wellbutrin.  He was contacted by neuropsych facility. In progress to complete the necessary paperwork before setting up an appointment.    Past Psychiatric History: Patient's psychiatric care started in 1995 first with therapy than medication.  He reports trying a number of medications but only remembers Prozac, bupropion, and Effexor in the antidepressant realm.  He has also only tried Adderall as far stimulants ago.  He denies any prior suicide attempts or psychiatric hospitalizations.  Bupropion XL 15 mg started on 08/13/22 and increased to 300 mg on 10/21/2022  Patient reports having half a glass of wine with dinner and using pot occasionally around once or twice a week.  He notes that he does smoke nicotine but wants to quit.    Past Medical History:  Past Medical History:  Diagnosis Date   Hashimoto's disease     Past Surgical History:  Procedure Laterality Date  HAND TENDON SURGERY Left     Family Psychiatric History: His brother has depression and committed suicide  Family History: No family history on file.  Social History:  Social History   Socioeconomic History   Marital status: Divorced    Spouse name: Not on file   Number of children: Not on file   Years of education:  Not on file   Highest education level: Not on file  Occupational History   Not on file  Tobacco Use   Smoking status: Never    Passive exposure: Never   Smokeless tobacco: Never  Vaping Use   Vaping Use: Every day  Substance and Sexual Activity   Alcohol use: Yes    Comment: occasionally   Drug use: Yes    Types: Marijuana   Sexual activity: Yes  Other Topics Concern   Not on file  Social History Narrative   Not on file   Social Determinants of Health   Financial Resource Strain: Not on file  Food Insecurity: Not on file  Transportation Needs: No Transportation Needs (06/10/2022)   PRAPARE - Transportation    Lack of Transportation (Medical): No    Lack of Transportation (Non-Medical): No  Physical Activity: Sufficiently Active (06/10/2022)   Exercise Vital Sign    Days of Exercise per Week: 5 days    Minutes of Exercise per Session: 60 min  Stress: Not on file  Social Connections: Not on file    Allergies: No Known Allergies  Current Medications: Current Outpatient Medications  Medication Sig Dispense Refill   amphetamine-dextroamphetamine (ADDERALL) 10 MG tablet Take 3 tablets (30 mg total) by mouth 2 (two) times daily. (Patient not taking: Reported on 10/04/2022) 51 tablet 0   amphetamine-dextroamphetamine (ADDERALL) 20 MG tablet Take 1 tablet (20 mg total) by mouth 2 (two) times daily. (Patient not taking: Reported on 10/04/2022) 15 tablet 0   buPROPion (WELLBUTRIN XL) 150 MG 24 hr tablet Take 1 tablet (150 mg total) by mouth every morning. 30 tablet 0   ezetimibe (ZETIA) 10 MG tablet Take 1 tablet (10 mg total) by mouth daily. 90 tablet 3   levothyroxine (SYNTHROID) 137 MCG tablet Take 1 tablet (137 mcg total) by mouth daily before breakfast. 30 tablet 2   sildenafil (VIAGRA) 25 MG tablet Take 1 tablet (25 mg total) by mouth daily as needed for erectile dysfunction. Erectile dysfunction: do not take >1 dose per day 15 tablet 0   venlafaxine XR (EFFEXOR-XR) 37.5 MG  24 hr capsule Take 1 capsule (37.5 mg total) by mouth daily. 30 capsule 2   No current facility-administered medications for this visit.     Psychiatric Specialty Exam: Review of Systems  There were no vitals taken for this visit.There is no height or weight on file to calculate BMI.  General Appearance: Fairly Groomed  Eye Contact:  Good  Speech:  Clear and Coherent and Normal Rate  Volume:  Normal  Mood:  Euthymic  Affect:  Congruent  Thought Process:  Coherent  Orientation:  Full (Time, Place, and Person)  Thought Content: Logical   Suicidal Thoughts:  No  Homicidal Thoughts:  No  Memory:  NA  Judgement:  Good  Insight:  Fair  Psychomotor Activity:  Normal  Concentration:  Concentration: Good  Recall:  Good  Fund of Knowledge: Fair  Language: Good  Akathisia:  NA    AIMS (if indicated): not done  Assets:  Communication Skills Desire for Improvement Housing Physical Health  ADL's:  Intact  Cognition: WNL  Sleep:  Good   Metabolic Disorder Labs: Lab Results  Component Value Date   HGBA1C 5.5 09/11/2022   No results found for: "PROLACTIN" Lab Results  Component Value Date   CHOL 216 (H) 09/11/2022   TRIG 111 09/11/2022   HDL 41 09/11/2022   CHOLHDL 5.3 (H) 09/11/2022   LDLCALC 155 (H) 09/11/2022   LDLCALC 128 (H) 06/06/2022   Lab Results  Component Value Date   TSH 2.040 09/11/2022   TSH 0.952 06/06/2022    Therapeutic Level Labs: No results found for: "LITHIUM" No results found for: "VALPROATE" No results found for: "CBMZ"   Screenings: GAD-7    Flowsheet Row Office Visit from 09/04/2022 in [facility] Office Visit from 08/13/2022 in [facility]  Total GAD-7 Score 0 6      PHQ2-9    Flowsheet Row Office Visit from 09/04/2022 in [facility] Office Visit from 08/13/2022 in [facility] Office Visit from 06/05/2022 in [facility]  Office Visit from 02/25/2022 in [facility]  PHQ-2 Total Score 0 0 0 2  PHQ-9 Total Score 7 -- -- 3      Flowsheet Row Office Visit from 08/13/2022 in [facility] ED from 03/31/2022 in [facility]PART[facility]  C-SSRS RISK CATEGORY No Risk No Risk       Collaboration of Care: Collaboration of Care: Medication Management AEB medication prescription and Primary Care Provider AEB chart review  Patient/Guardian was advised Release of Information must be obtained prior to any record release in order to collaborate their care with an outside provider. Patient/Guardian was advised if they have not already done so to contact the registration department to sign all necessary forms in order for [place] to release information regarding their care.   Consent: Patient/Guardian gives verbal consent for treatment and assignment of benefits for services provided during this visit. Patient/Guardian expressed understanding and agreed to proceed.    Stasia Cavalier, MD 10/21/2022, 8:46 AM   Virtual Visit via Video Note  I connected with Troy Allen on 10/21/22 at  2:00 PM EST by a video enabled telemedicine application and verified that I am speaking with the correct person using two identifiers.  Location: Patient: Home  Provider: Home Office   I discussed the limitations of evaluation and management by telemedicine and the availability of in person appointments. The patient expressed understanding and agreed to proceed.   I discussed the assessment and treatment plan with the patient. The patient was provided an opportunity to ask questions and all were answered. The patient agreed with the plan and demonstrated an understanding of the instructions.   The patient was advised to call back or seek an in-person evaluation if the symptoms worsen or if the condition fails to improve as anticipated.  I provided 20 minutes of non-face-to-face time  during this encounter.   Stasia Cavalier, MD

## 2022-11-04 ENCOUNTER — Other Ambulatory Visit: Payer: Self-pay | Admitting: Family Medicine

## 2022-11-04 DIAGNOSIS — N528 Other male erectile dysfunction: Secondary | ICD-10-CM

## 2022-11-05 ENCOUNTER — Other Ambulatory Visit: Payer: Self-pay | Admitting: Family Medicine

## 2022-11-05 DIAGNOSIS — N528 Other male erectile dysfunction: Secondary | ICD-10-CM

## 2022-11-05 MED ORDER — SILDENAFIL CITRATE 50 MG PO TABS: 50.0000 mg | ORAL_TABLET | Freq: Every day | ORAL | 0 refills | Status: DC | PRN

## 2022-11-21 ENCOUNTER — Encounter (HOSPITAL_COMMUNITY): Payer: Self-pay | Admitting: Psychiatry

## 2022-11-21 ENCOUNTER — Telehealth (HOSPITAL_BASED_OUTPATIENT_CLINIC_OR_DEPARTMENT_OTHER): Payer: PPO | Admitting: Psychiatry

## 2022-11-21 DIAGNOSIS — E063 Autoimmune thyroiditis: Secondary | ICD-10-CM | POA: Diagnosis not present

## 2022-11-21 DIAGNOSIS — F321 Major depressive disorder, single episode, moderate: Secondary | ICD-10-CM

## 2022-11-21 MED ORDER — BUPROPION HCL ER (XL) 450 MG PO TB24: 450.0000 mg | ORAL_TABLET | ORAL | 1 refills | Status: DC

## 2022-11-21 NOTE — Progress Notes (Signed)
BH MD/PA/NP OP Progress Note  11/21/2022 9:05 AM Troy Allen  MRN:  [id number]  Visit Diagnosis:    ICD-10-CM   1. Current moderate episode of major depressive disorder, unspecified whether recurrent (HCC)  F32.1     2. Hashimoto's disease  E06.3       Assessment:  Troy Allen is a 67 y.o. male with a history of depression, hashimoto's disease, and reported ADD who presented to [facility] at [facility] for initial evaluation on 08/13/2022.  During initial evaluation patient reported a history of depression and ADHD though the depression has been well controlled.  He reported experiencing symptoms of fatigue, low mood, amotivation, and anhedonia during periods of depression.  He had 1 instance of passive SI with no plan or intent over 20 years ago.  He denied any SI/HI or thoughts of self-harm at this time along with any history of mania, psychosis, paranoia, or delusions.  Patient also endorsed symptoms of poor concentration, increased sluggishness, fatigue, and impaired memory.  Patient felt these symptoms improved when he had taken Adderall in the past.  Of note patient has a past history of Hashimoto's disease which he is on Synthroid and TSH is currently within normal limits.  Patient met criteria for MDD currently stable and further clarity via neuropsych testing is needed to rule out ADHD.     Troy Allen presents for follow-up evaluation. Today, 11/21/22, patient reports experiencing a buzzing feeling and increased sweating after starting Wellbutrin and that resolved after a few days.  While he does feel the 300 mg dose is slightly more effective than the 150 mg dose for his concentration, is not as noticeable as when he first started the 150.  Patient is interested in increasing Wellbutrin to 450 mg today and went over the risk and benefits.  He is continuing to pursue neuropsych testing.  Would also like to discuss the venlafaxine at his next  appointment and review whether his continuation is necessary.   Plan: - Continue Venlafaxine 37.5 mg QD - Increase Bupropion XL to 450 mg - Discontinue Adderall 30 mg BID - CMP, CBC, lipid profile, vitamin D, TSH, and free T4 reviewed - Neuropsych testing referral, completing initial paperwork - Crisis resources reviewed - Follow up in a month    Chief Complaint:  Chief Complaint  Patient presents with   Follow-up   HPI: Patient presents reporting that he is alright over the past month. The first few days after increasing the medication it was a little much, but that has dissipated now.  He reports that he was feeling a buzzing feeling now which has since resolved. He also can get some intermittent sweating, though questions whether it is caused by the electric blanket he uses. The sweating does seem to coincide with violent dreams that he has had for an extended period of time. At this point most of the side effects have resolved. Maybe some improvement with the Wellbutrin at 300 but not as noticeable as from 0-150 mg. He still gets period where he feels like he is crashing half way through the day. Typically up until 1-2 and gets up around 9-10 in the morning. Has to take naps during the day occasionally.  He was contacted by neuropsych facility. Now verifying the cost with insurance.  Patient also endorsed experienced ED which has been a consistent side effect of Effexor in the past.  He would like to discuss potentially discontinuing it at his next appointment.  Past Psychiatric History: Patient's psychiatric care started in 1995 first with therapy than medication.  He reports trying a number of medications but only remembers Prozac, bupropion, and Effexor in the antidepressant realm.  He has also only tried Adderall as far stimulants ago.  He denies any prior suicide attempts or psychiatric hospitalizations.  Bupropion XL 150 mg started on 08/13/22 and increased to 300 mg on  10/21/2022  Patient reports having half a glass of wine with dinner and using pot occasionally around once or twice a week.  He notes that he does smoke nicotine but wants to quit.    Past Medical History:  Past Medical History:  Diagnosis Date   Hashimoto's disease     Past Surgical History:  Procedure Laterality Date   HAND TENDON SURGERY Left     Family Psychiatric History: His brother has depression and committed suicide  Family History: No family history on file.  Social History:  Social History   Socioeconomic History   Marital status: Divorced    Spouse name: Not on file   Number of children: Not on file   Years of education: Not on file   Highest education level: Not on file  Occupational History   Not on file  Tobacco Use   Smoking status: Never    Passive exposure: Never   Smokeless tobacco: Never  Vaping Use   Vaping Use: Every day  Substance and Sexual Activity   Alcohol use: Yes    Comment: occasionally   Drug use: Yes    Types: Marijuana   Sexual activity: Yes  Other Topics Concern   Not on file  Social History Narrative   Not on file   Social Determinants of Health   Financial Resource Strain: Not on file  Food Insecurity: Not on file  Transportation Needs: No Transportation Needs (06/10/2022)   PRAPARE - Transportation    Lack of Transportation (Medical): No    Lack of Transportation (Non-Medical): No  Physical Activity: Sufficiently Active (06/10/2022)   Exercise Vital Sign    Days of Exercise per Week: 5 days    Minutes of Exercise per Session: 60 min  Stress: Not on file  Social Connections: Not on file    Allergies: No Known Allergies  Current Medications: Current Outpatient Medications  Medication Sig Dispense Refill   amphetamine-dextroamphetamine (ADDERALL) 10 MG tablet Take 3 tablets (30 mg total) by mouth 2 (two) times daily. (Patient not taking: Reported on 10/04/2022) 51 tablet 0   amphetamine-dextroamphetamine (ADDERALL) 20  MG tablet Take 1 tablet (20 mg total) by mouth 2 (two) times daily. (Patient not taking: Reported on 10/04/2022) 15 tablet 0   buPROPion (WELLBUTRIN XL) 300 MG 24 hr tablet Take 1 tablet (300 mg total) by mouth every morning. 30 tablet 1   ezetimibe (ZETIA) 10 MG tablet Take 1 tablet (10 mg total) by mouth daily. 90 tablet 3   levothyroxine (SYNTHROID) 137 MCG tablet Take 1 tablet (137 mcg total) by mouth daily before breakfast. 30 tablet 2   sildenafil (VIAGRA) 50 MG tablet Take 1 tablet (50 mg total) by mouth daily as needed for erectile dysfunction. 10 tablet 0   venlafaxine XR (EFFEXOR-XR) 37.5 MG 24 hr capsule Take 1 capsule (37.5 mg total) by mouth daily. 30 capsule 2   No current facility-administered medications for this visit.     Psychiatric Specialty Exam: Review of Systems  There were no vitals taken for this visit.There is no height or weight on file to  calculate BMI.  General Appearance: Fairly Groomed  Eye Contact:  Good  Speech:  Clear and Coherent and Normal Rate  Volume:  Normal  Mood:  Euthymic  Affect:  Congruent  Thought Process:  Coherent  Orientation:  Full (Time, Place, and Person)  Thought Content: Logical   Suicidal Thoughts:  No  Homicidal Thoughts:  No  Memory:  NA  Judgement:  Good  Insight:  Fair  Psychomotor Activity:  Normal  Concentration:  Concentration: Good  Recall:  Good  Fund of Knowledge: Fair  Language: Good  Akathisia:  NA    AIMS (if indicated): not done  Assets:  Communication Skills Desire for Improvement Housing Physical Health  ADL's:  Intact  Cognition: WNL  Sleep:  Good   Metabolic Disorder Labs: Lab Results  Component Value Date   HGBA1C 5.5 09/11/2022   No results found for: "PROLACTIN" Lab Results  Component Value Date   CHOL 216 (H) 09/11/2022   TRIG 111 09/11/2022   HDL 41 09/11/2022   CHOLHDL 5.3 (H) 09/11/2022   LDLCALC 155 (H) 09/11/2022   LDLCALC 128 (H) 06/06/2022   Lab Results  Component Value Date    TSH 2.040 09/11/2022   TSH 0.952 06/06/2022    Therapeutic Level Labs: No results found for: "LITHIUM" No results found for: "VALPROATE" No results found for: "CBMZ"   Screenings: GAD-7    Flowsheet Row Office Visit from 09/04/2022 in [facility] Office Visit from 08/13/2022 in [facility]  Total GAD-7 Score 0 6      PHQ2-9    [place] Office Visit from 09/04/2022 in [facility] Office Visit from 08/13/2022 in [facility] Office Visit from 06/05/2022 in [facility] Office Visit from 02/25/2022 in [facility]  PHQ-2 Total Score 0 0 0 2  PHQ-9 Total Score 7 -- -- 3      [place] Office Visit from 08/13/2022 in [facility] ED from 03/31/2022 in [facility] Emergency Department at [place] No Risk No Risk       Collaboration of Care: Collaboration of Care: Medication Management AEB medication prescription and Primary Care Provider AEB chart review  Patient/Guardian was advised Release of Information must be obtained prior to any record release in order to collaborate their care with an outside provider. Patient/Guardian was advised if they have not already done so to contact the registration department to sign all necessary forms in order for [place] to release information regarding their care.   Consent: Patient/Guardian gives verbal consent for treatment and assignment of benefits for services provided during this visit. Patient/Guardian expressed understanding and agreed to proceed.    Vista Mink, MD 11/21/2022, 9:05 AM   Virtual Visit via Video Note  I connected with Troy Allen on 11/21/22 at  1:30 PM EST by a video enabled telemedicine application and verified that I am speaking with the correct person using two  identifiers.  Location: Patient: Home  Provider: Home Office   I discussed the limitations of evaluation and management by telemedicine and the availability of in person appointments. The patient expressed understanding and agreed to proceed.   I discussed the assessment and treatment plan with the patient. The patient was provided an opportunity to ask questions and all were answered. The patient agreed with the plan and demonstrated an understanding of the instructions.  The patient was advised to call back or seek an in-person evaluation if the symptoms worsen or if the condition fails to improve as anticipated.  I provided 20 minutes of non-face-to-face time during this encounter.   Vista Mink, MD

## 2022-12-03 ENCOUNTER — Other Ambulatory Visit (HOSPITAL_COMMUNITY): Payer: Self-pay | Admitting: Psychiatry

## 2022-12-03 ENCOUNTER — Telehealth (HOSPITAL_COMMUNITY): Payer: Self-pay | Admitting: *Deleted

## 2022-12-03 DIAGNOSIS — F321 Major depressive disorder, single episode, moderate: Secondary | ICD-10-CM

## 2022-12-03 MED ORDER — BUPROPION HCL ER (XL) 300 MG PO TB24: 300.0000 mg | ORAL_TABLET | ORAL | 1 refills | Status: DC

## 2022-12-03 NOTE — Telephone Encounter (Signed)
Pt called asking if he should continue taking the Wellbutrin 450 mg TB 24 as he is having tremors, "quivers", and issues with his balance. Pt next appointment is scheduled for 12/20/22. Please review.

## 2022-12-11 IMAGING — DX DG FINGER INDEX 2+V*R*
3 series · 3 of 3 positions shown · non-contrast
Comparison: None Available.

CLINICAL DATA: Trauma to finger.  Laceration.

EXAM:
RIGHT INDEX FINGER 2+V

[finger ap]
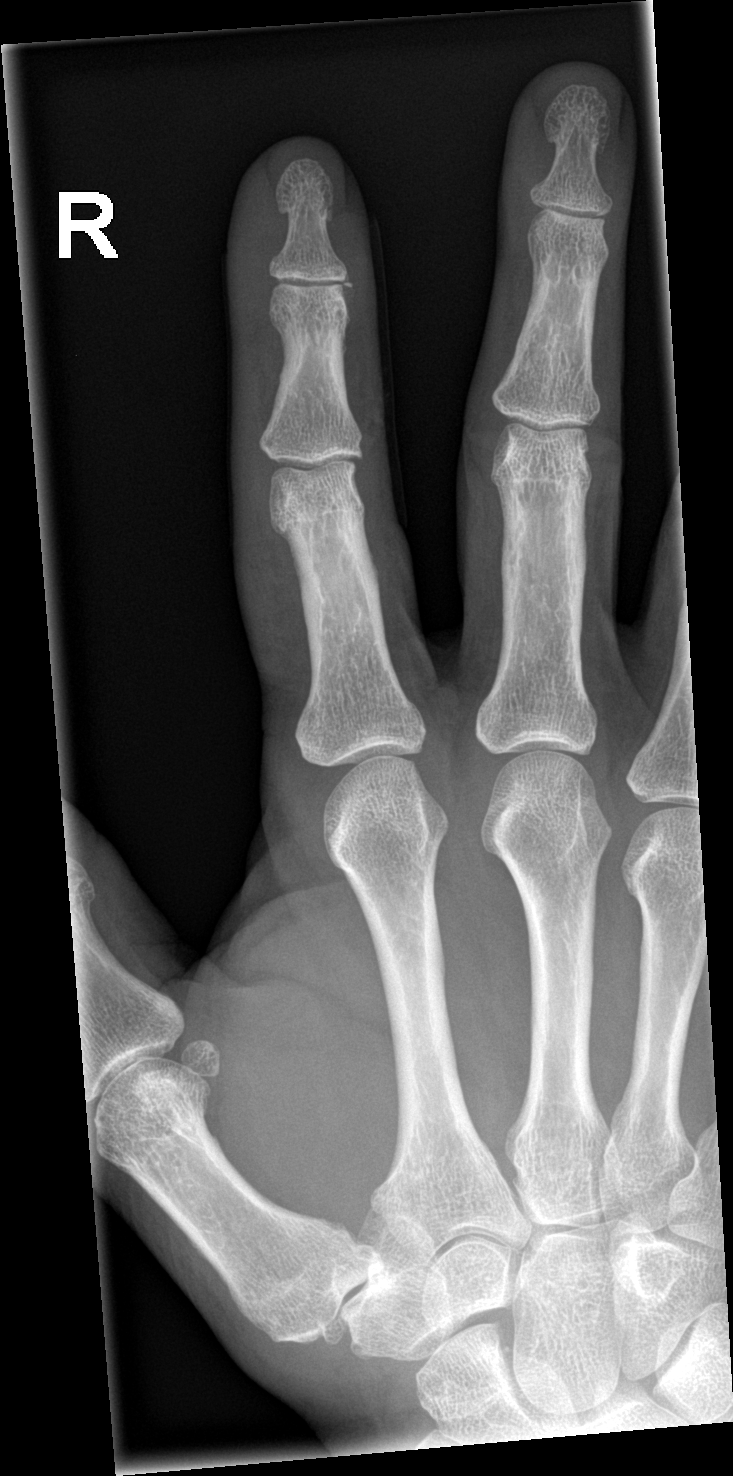

[finger obl]
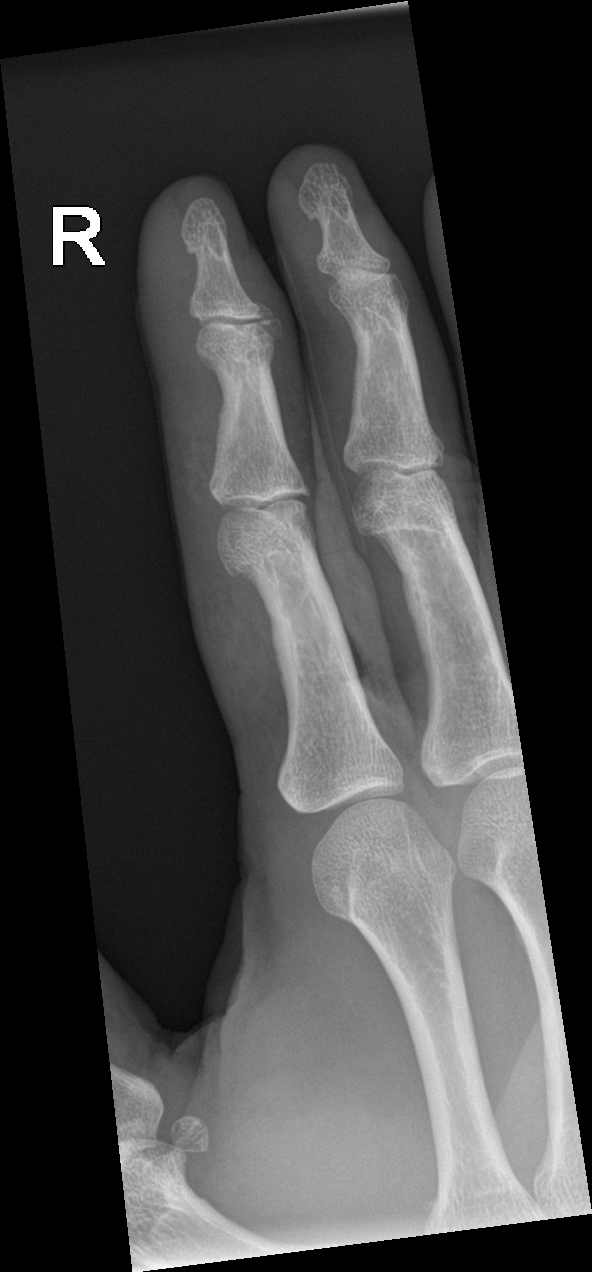

[finger lat]
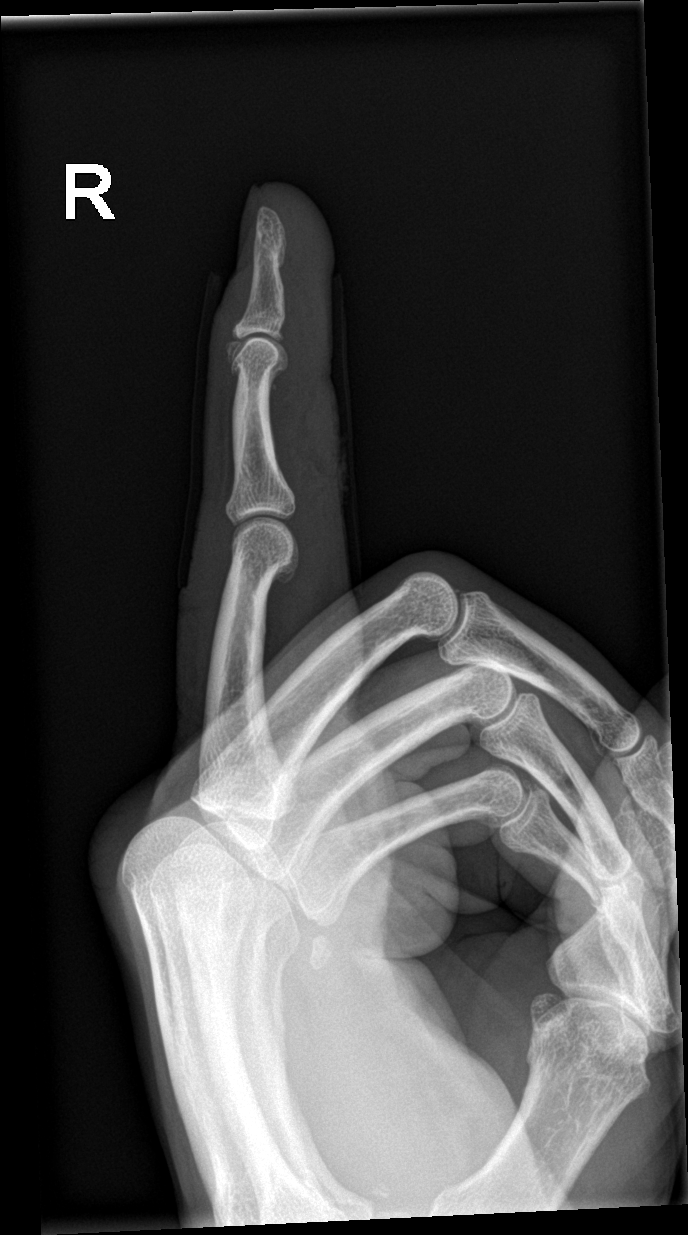

[3 of 3 positions shown; findings below may reference images not displayed]

FINDINGS: Soft tissue laceration noted along the ventral aspect of the finger.
No underlying fracture or foreign body. Chronic degenerative changes
are noted at the DIP joint.
IMPRESSION: Soft tissue laceration without underlying fracture or foreign body.

## 2022-12-12 ENCOUNTER — Other Ambulatory Visit: Payer: Self-pay | Admitting: Family Medicine

## 2022-12-12 DIAGNOSIS — N528 Other male erectile dysfunction: Secondary | ICD-10-CM

## 2022-12-13 MED ORDER — SILDENAFIL CITRATE 50 MG PO TABS: 50.0000 mg | ORAL_TABLET | Freq: Every day | ORAL | 0 refills | Status: DC | PRN

## 2022-12-20 ENCOUNTER — Telehealth (HOSPITAL_BASED_OUTPATIENT_CLINIC_OR_DEPARTMENT_OTHER): Payer: PPO | Admitting: Psychiatry

## 2022-12-20 ENCOUNTER — Encounter (HOSPITAL_COMMUNITY): Payer: Self-pay | Admitting: Psychiatry

## 2022-12-20 DIAGNOSIS — F321 Major depressive disorder, single episode, moderate: Secondary | ICD-10-CM

## 2022-12-20 DIAGNOSIS — E063 Autoimmune thyroiditis: Secondary | ICD-10-CM | POA: Diagnosis not present

## 2022-12-20 MED ORDER — BUPROPION HCL ER (XL) 300 MG PO TB24: 300.0000 mg | ORAL_TABLET | ORAL | 2 refills | Status: DC

## 2022-12-20 MED ORDER — VENLAFAXINE HCL ER 37.5 MG PO CP24: 37.5000 mg | ORAL_CAPSULE | Freq: Every day | ORAL | 2 refills | Status: DC

## 2022-12-20 NOTE — Progress Notes (Signed)
[place] MD/PA/NP OP Progress Note  12/20/2022 8:26 AM Troy Allen  MRN:  [id number]  Visit Diagnosis:    ICD-10-CM   1. Current moderate episode of major depressive disorder, unspecified whether recurrent ([place])  F32.1     2. Hashimoto's disease  E06.3       Assessment:  Troy Allen is a 67 y.o. male with a history of depression, hashimoto's disease, and reported ADD who presented to [place] [facility] for initial evaluation on 08/13/2022.  During initial evaluation patient reported a history of depression and ADHD though the depression has been well controlled.  He reported experiencing symptoms of fatigue, low mood, amotivation, and anhedonia during periods of depression.  He had 1 instance of passive SI with no plan or intent over 20 years ago.  He denied any SI/HI or thoughts of self-harm at this time along with any history of mania, psychosis, paranoia, or delusions.  Patient also endorsed symptoms of poor concentration, increased sluggishness, fatigue, and impaired memory.  Patient felt these symptoms improved when he had taken Adderall in the past.  Of note patient has a past history of Hashimoto's disease which he is on Synthroid and TSH is currently within normal limits.  Patient met criteria for MDD currently stable and further clarity via neuropsych testing is needed to rule out ADHD.     [name] presents for follow-up evaluation. Today, 12/20/22, patient reports experiencing muscle fatigue and restlessness after increasing the Wellbutrin to 450 with no improvement in his symptoms over 2 weeks.  He contacted the office and decrease Wellbutrin to 300 mg at bedtime with her improvement in symptoms.  Patient was also turned into the neuropsych testing paperwork and is awaiting scheduling for this appointment.  We will continue on his current regimen at this time and await the results of neuropsych testing.  Plan: - Continue Venlafaxine 37.5 mg  QD - Decrease Bupropion XL to 300 mg - Discontinue Adderall 30 mg BID - CMP, CBC, lipid profile, vitamin D, TSH, and free T4 reviewed - Neuropsych testing referral, completing initial paperwork - Crisis resources reviewed - Follow up in a month    Chief Complaint:  Chief Complaint  Patient presents with   Follow-up   HPI: [name] called in the interim and described feeling muscle fatigue, decreased balance, tremors in his hands for 2 weeks after increasing the Wellbutrin to 450 mg with minimal improvement. He was recommended to decrease the dose back to 300 mg daily.    Patient presents reporting that after stopping the Wellbutrin the symptoms have improved. He has not thought much about stopping Effexor yet with all that happened. For now does not want to rock the boat. He is doing well with meeting with his editor once a week and completed the neuropsych testing paperwork yesterday.  Patient found that the cost is 450-500 and insurance covers nothing and he is hopeful that they will.  Patient is awaiting an appointment date.  Past Psychiatric History: Patient's psychiatric care started in 1995 first with therapy than medication.  He reports trying a number of medications but only remembers Prozac, bupropion, and Effexor in the antidepressant realm.  He has also only tried Adderall as far stimulants ago.  He denies any prior suicide attempts or psychiatric hospitalizations.  Bupropion XL 150 mg started on 08/13/22 and increased to 300 mg on 10/21/2022  Patient reports having half a glass of wine with dinner and using pot occasionally around once  or twice a week.  He notes that he does smoke nicotine but wants to quit.    Past Medical History:  Past Medical History:  Diagnosis Date   Hashimoto's disease     Past Surgical History:  Procedure Laterality Date   HAND TENDON SURGERY Left     Family Psychiatric History: His brother has depression and committed suicide  Family History:  No family history on file.  Social History:  Social History   Socioeconomic History   Marital status: Divorced    Spouse name: Not on file   Number of children: Not on file   Years of education: Not on file   Highest education level: Not on file  Occupational History   Not on file  Tobacco Use   Smoking status: Never    Passive exposure: Never   Smokeless tobacco: Never  Vaping Use   Vaping Use: Every day  Substance and Sexual Activity   Alcohol use: Yes    Comment: occasionally   Drug use: Yes    Types: Marijuana   Sexual activity: Yes  Other Topics Concern   Not on file  Social History Narrative   Not on file   Social Determinants of Health   Financial Resource Strain: Not on file  Food Insecurity: Not on file  Transportation Needs: No Transportation Needs (06/10/2022)   PRAPARE - Transportation    Lack of Transportation (Medical): No    Lack of Transportation (Non-Medical): No  Physical Activity: Sufficiently Active (06/10/2022)   Exercise Vital Sign    Days of Exercise per Week: 5 days    Minutes of Exercise per Session: 60 min  Stress: Not on file  Social Connections: Not on file    Allergies: No Known Allergies  Current Medications: Current Outpatient Medications  Medication Sig Dispense Refill   amphetamine-dextroamphetamine (ADDERALL) 10 MG tablet Take 3 tablets (30 mg total) by mouth 2 (two) times daily. (Patient not taking: Reported on 10/04/2022) 51 tablet 0   amphetamine-dextroamphetamine (ADDERALL) 20 MG tablet Take 1 tablet (20 mg total) by mouth 2 (two) times daily. (Patient not taking: Reported on 10/04/2022) 15 tablet 0   buPROPion (WELLBUTRIN XL) 300 MG 24 hr tablet Take 1 tablet (300 mg total) by mouth every morning. 30 tablet 1   ezetimibe (ZETIA) 10 MG tablet Take 1 tablet (10 mg total) by mouth daily. 90 tablet 3   levothyroxine (SYNTHROID) 137 MCG tablet Take 1 tablet (137 mcg total) by mouth daily before breakfast. 30 tablet 2   sildenafil  (VIAGRA) 50 MG tablet Take 1 tablet (50 mg total) by mouth daily as needed for erectile dysfunction. 10 tablet 0   venlafaxine XR (EFFEXOR-XR) 37.5 MG 24 hr capsule Take 1 capsule (37.5 mg total) by mouth daily. 30 capsule 2   No current facility-administered medications for this visit.     Psychiatric Specialty Exam: Review of Systems  There were no vitals taken for this visit.There is no height or weight on file to calculate BMI.  General Appearance: Fairly Groomed  Eye Contact:  Good  Speech:  Clear and Coherent and Normal Rate  Volume:  Normal  Mood:  Euthymic  Affect:  Congruent  Thought Process:  Coherent  Orientation:  Full (Time, Place, and Person)  Thought Content: Logical   Suicidal Thoughts:  No  Homicidal Thoughts:  No  Memory:  NA  Judgement:  Good  Insight:  Fair  Psychomotor Activity:  Normal  Concentration:  Concentration: Good  Recall:  Good  Fund of Knowledge: Fair  Language: Good  Akathisia:  NA    AIMS (if indicated): not done  Assets:  Communication Skills Desire for Improvement Housing Physical Health  ADL's:  Intact  Cognition: WNL  Sleep:  Good   Metabolic Disorder Labs: Lab Results  Component Value Date   HGBA1C 5.5 09/11/2022   No results found for: "PROLACTIN" Lab Results  Component Value Date   CHOL 216 (H) 09/11/2022   TRIG 111 09/11/2022   HDL 41 09/11/2022   CHOLHDL 5.3 (H) 09/11/2022   LDLCALC 155 (H) 09/11/2022   LDLCALC 128 (H) 06/06/2022   Lab Results  Component Value Date   TSH 2.040 09/11/2022   TSH 0.952 06/06/2022    Therapeutic Level Labs: No results found for: "LITHIUM" No results found for: "VALPROATE" No results found for: "CBMZ"   Screenings: GAD-7    Flowsheet Row Office Visit from 09/04/2022 in [facility] Office Visit from 08/13/2022 in [facility]  Total GAD-7 Score 0 6      PHQ2-9    [place] Office Visit from 09/04/2022 in  [facility] Office Visit from 08/13/2022 in [facility] Office Visit from 06/05/2022 in [facility] Office Visit from 02/25/2022 in [facility]  PHQ-2 Total Score 0 0 0 2  PHQ-9 Total Score 7 -- -- 3      [place] Office Visit from 08/13/2022 in [facility] ED from 03/31/2022 in [facility] Emergency Department at [place] No Risk No Risk       Collaboration of Care: Collaboration of Care: Medication Management AEB medication prescription and Primary Care Provider AEB chart review  Patient/Guardian was advised Release of Information must be obtained prior to any record release in order to collaborate their care with an outside provider. Patient/Guardian was advised if they have not already done so to contact the registration department to sign all necessary forms in order for [place] to release information regarding their care.   Consent: Patient/Guardian gives verbal consent for treatment and assignment of benefits for services provided during this visit. Patient/Guardian expressed understanding and agreed to proceed.    Vista Mink, MD 12/20/2022, 8:26 AM   Virtual Visit via Video Note  I connected with Lorin Picket on 12/20/22 at 10:30 AM EST by a video enabled telemedicine application and verified that I am speaking with the correct person using two identifiers.  Location: Patient: Home  Provider: Home Office   I discussed the limitations of evaluation and management by telemedicine and the availability of in person appointments. The patient expressed understanding and agreed to proceed.   I discussed the assessment and treatment plan with the patient. The patient was provided an opportunity to ask questions and all were answered. The patient agreed with the plan and demonstrated an understanding  of the instructions.   The patient was advised to call back or seek an in-person evaluation if the symptoms worsen or if the condition fails to improve as anticipated.  I provided 15 minutes of non-face-to-face time during this encounter.   Vista Mink, MD

## 2023-01-03 ENCOUNTER — Ambulatory Visit: Payer: PPO | Admitting: Family Medicine

## 2023-01-10 ENCOUNTER — Encounter: Payer: Self-pay | Admitting: Family Medicine

## 2023-01-10 ENCOUNTER — Ambulatory Visit (INDEPENDENT_AMBULATORY_CARE_PROVIDER_SITE_OTHER): Payer: PPO | Admitting: Family Medicine

## 2023-01-10 VITALS — BP 122/74 | HR 88 | Ht 68.0 in | Wt 165.1 lb

## 2023-01-10 DIAGNOSIS — E785 Hyperlipidemia, unspecified: Secondary | ICD-10-CM | POA: Diagnosis not present

## 2023-01-10 DIAGNOSIS — E559 Vitamin D deficiency, unspecified: Secondary | ICD-10-CM | POA: Diagnosis not present

## 2023-01-10 DIAGNOSIS — R7301 Impaired fasting glucose: Secondary | ICD-10-CM

## 2023-01-10 DIAGNOSIS — E7849 Other hyperlipidemia: Secondary | ICD-10-CM | POA: Diagnosis not present

## 2023-01-10 DIAGNOSIS — F3289 Other specified depressive episodes: Secondary | ICD-10-CM | POA: Diagnosis not present

## 2023-01-10 DIAGNOSIS — E063 Autoimmune thyroiditis: Secondary | ICD-10-CM | POA: Diagnosis not present

## 2023-01-10 DIAGNOSIS — N528 Other male erectile dysfunction: Secondary | ICD-10-CM

## 2023-01-10 DIAGNOSIS — E0789 Other specified disorders of thyroid: Secondary | ICD-10-CM

## 2023-01-10 MED ORDER — SILDENAFIL CITRATE 100 MG PO TABS: 50.0000 mg | ORAL_TABLET | Freq: Every day | ORAL | 11 refills | Status: DC | PRN

## 2023-01-10 MED ORDER — EZETIMIBE 10 MG PO TABS: 10.0000 mg | ORAL_TABLET | Freq: Every day | ORAL | 3 refills | Status: DC

## 2023-01-10 NOTE — Assessment & Plan Note (Signed)
Stable condition Pending thyroid levels Lab Results  Component Value Date   TSH 2.040 09/11/2022

## 2023-01-10 NOTE — Assessment & Plan Note (Signed)
He takes ezetimibe 10 mg daily No complete concerns voiced Refill sent to the pharmacy Pending lipid panel

## 2023-01-10 NOTE — Assessment & Plan Note (Signed)
Stable on Wellbutrin 300 mg daily and Effexor 37.5 mg daily No reports of suicidal thoughts and ideation Encouraged to continue following up with his psychiatrist

## 2023-01-10 NOTE — Progress Notes (Signed)
Established Patient Office Visit  Subjective:  Patient ID: Troy Allen, male    DOB: January 27, 1956  Age: 67 y.o. MRN: [id number]  CC:  Chief Complaint  Patient presents with   Follow-up    4 month f/u would like to discuss sildenafil strength. Pt would like to discuss arthritis, hand, hip and knee pain has been going.     HPI Troy Allen is a 67 y.o. male with past medical history of hyperlipidemia, erectile dysfunction, and depression presents for f/u of  chronic medical conditions. For the details of today's visit, please refer to the assessment and plan.     Past Medical History:  Diagnosis Date   Hashimoto's disease     Past Surgical History:  Procedure Laterality Date   HAND TENDON SURGERY Left     No family history on file.  Social History   Socioeconomic History   Marital status: Divorced    Spouse name: Not on file   Number of children: Not on file   Years of education: Not on file   Highest education level: Not on file  Occupational History   Not on file  Tobacco Use   Smoking status: Never    Passive exposure: Never   Smokeless tobacco: Never  Vaping Use   Vaping Use: Every day  Substance and Sexual Activity   Alcohol use: Yes    Comment: occasionally   Drug use: Yes    Types: Marijuana   Sexual activity: Yes  Other Topics Concern   Not on file  Social History Narrative   Not on file   Social Determinants of Health   Financial Resource Strain: Not on file  Food Insecurity: Not on file  Transportation Needs: No Transportation Needs (06/10/2022)   PRAPARE - Transportation    Lack of Transportation (Medical): No    Lack of Transportation (Non-Medical): No  Physical Activity: Sufficiently Active (06/10/2022)   Exercise Vital Sign    Days of Exercise per Week: 5 days    Minutes of Exercise per Session: 60 min  Stress: Not on file  Social Connections: Not on file  Intimate Partner Violence: Not on file    Outpatient Medications  Prior to Visit  Medication Sig Dispense Refill   amphetamine-dextroamphetamine (ADDERALL) 10 MG tablet Take 3 tablets (30 mg total) by mouth 2 (two) times daily. 51 tablet 0   amphetamine-dextroamphetamine (ADDERALL) 20 MG tablet Take 1 tablet (20 mg total) by mouth 2 (two) times daily. 15 tablet 0   buPROPion (WELLBUTRIN XL) 300 MG 24 hr tablet Take 1 tablet (300 mg total) by mouth every morning. 30 tablet 2   levothyroxine (SYNTHROID) 137 MCG tablet Take 1 tablet (137 mcg total) by mouth daily before breakfast. 30 tablet 2   venlafaxine XR (EFFEXOR-XR) 37.5 MG 24 hr capsule Take 1 capsule (37.5 mg total) by mouth daily. 30 capsule 2   ezetimibe (ZETIA) 10 MG tablet Take 1 tablet (10 mg total) by mouth daily. 90 tablet 3   sildenafil (VIAGRA) 50 MG tablet Take 1 tablet (50 mg total) by mouth daily as needed for erectile dysfunction. 10 tablet 0   No facility-administered medications prior to visit.    No Known Allergies  ROS Review of Systems  Constitutional:  Negative for fatigue and fever.  Eyes:  Negative for visual disturbance.  Respiratory:  Negative for chest tightness and shortness of breath.   Cardiovascular:  Negative for chest pain and palpitations.  Neurological:  Negative for dizziness  and headaches.      Objective:    Physical Exam HENT:     Head: Normocephalic.     Right Ear: External ear normal.     Left Ear: External ear normal.     Nose: No congestion or rhinorrhea.     Mouth/Throat:     Mouth: Mucous membranes are moist.  Cardiovascular:     Rate and Rhythm: Regular rhythm.     Heart sounds: No murmur heard. Pulmonary:     Effort: No respiratory distress.     Breath sounds: Normal breath sounds.  Neurological:     Mental Status: He is alert.     BP 122/74   Pulse 88   Ht 5\' 8"  (1.727 m)   Wt 165 lb 1.3 oz (74.9 kg)   SpO2 96%   BMI 25.10 kg/m  Wt Readings from Last 3 Encounters:  01/10/23 165 lb 1.3 oz (74.9 kg)  10/04/22 160 lb (72.6 kg)   09/04/22 161 lb 0.6 oz (73 kg)    Lab Results  Component Value Date   TSH 2.040 09/11/2022   Lab Results  Component Value Date   WBC 6.9 09/11/2022   HGB 14.7 09/11/2022   HCT 42.2 09/11/2022   MCV 96 09/11/2022   PLT 310 09/11/2022   Lab Results  Component Value Date   NA 139 09/11/2022   K 4.9 09/11/2022   CO2 26 09/11/2022   GLUCOSE 89 09/11/2022   BUN 14 09/11/2022   CREATININE 0.95 09/11/2022   BILITOT 0.4 09/11/2022   ALKPHOS 65 09/11/2022   AST 19 09/11/2022   ALT 16 09/11/2022   PROT 7.2 09/11/2022   ALBUMIN 4.5 09/11/2022   CALCIUM 9.5 09/11/2022   EGFR 88 09/11/2022   Lab Results  Component Value Date   CHOL 216 (H) 09/11/2022   Lab Results  Component Value Date   HDL 41 09/11/2022   Lab Results  Component Value Date   LDLCALC 155 (H) 09/11/2022   Lab Results  Component Value Date   TRIG 111 09/11/2022   Lab Results  Component Value Date   CHOLHDL 5.3 (H) 09/11/2022   Lab Results  Component Value Date   HGBA1C 5.5 09/11/2022      Assessment & Plan:  Hyperlipidemia LDL goal <100 Assessment & Plan: He takes ezetimibe 10 mg daily No complete concerns voiced Refill sent to the pharmacy Pending lipid panel  Orders: -     Ezetimibe; Take 1 tablet (10 mg total) by mouth daily.  Dispense: 90 tablet; Refill: 3  Hashimoto's disease Assessment & Plan: Stable condition Pending thyroid levels Lab Results  Component Value Date   TSH 2.040 09/11/2022      Other depression Assessment & Plan: Stable on Wellbutrin 300 mg daily and Effexor 37.5 mg daily No reports of suicidal thoughts and ideation Encouraged to continue following up with his psychiatrist   Other male erectile dysfunction Assessment & Plan: Requesting medication refill Refill sent to the pharmacy  Orders: -     Sildenafil Citrate; Take 0.5-1 tablets (50-100 mg total) by mouth daily as needed for erectile dysfunction.  Dispense: 10 tablet; Refill: 11  Vitamin D  deficiency -     VITAMIN D 25 Hydroxy (Vit-D Deficiency, Fractures)  Other specified disorders of thyroid -     TSH + free T4  Other hyperlipidemia -     CBC with Differential/Platelet -     CMP14+EGFR -     Lipid panel  Impaired  fasting blood sugar -     Hemoglobin A1c    Follow-up: Return in about 4 months (around 05/12/2023).   Alvira Monday, FNP

## 2023-01-10 NOTE — Patient Instructions (Signed)
I appreciate the opportunity to provide care to you today!    Follow up:  4 months  Labs: please stop by the lab during the week to get your blood drawn (CBC, CMP, TSH, Lipid profile, HgA1c, Vit D)  -Please pick up your prescriptions at the pharmacy   Please continue to a heart-healthy diet and increase your physical activities. Try to exercise for 66mins at least five days a week.   Physical activity helps: Lower your blood glucose, improve your heart health, lower your blood pressure and cholesterol, burn calories to help manage her weight, gave you energy, lower stress, and improve his sleep.  The American diabetes Association (ADA) recommends being active for 2-1/2 hours (150 minutes) or more week.  Exercise for 30 minutes, 5 days a week (150 minutes total)    It was a pleasure to see you and I look forward to continuing to work together on your health and well-being. Please do not hesitate to call the office if you need care or have questions about your care.   Have a wonderful day and week. With Gratitude, Alvira Monday MSN, FNP-BC

## 2023-01-10 NOTE — Assessment & Plan Note (Signed)
Requesting medication refill Refill sent to the pharmacy

## 2023-01-13 ENCOUNTER — Ambulatory Visit: Payer: PPO | Admitting: Family Medicine

## 2023-01-14 DIAGNOSIS — E7849 Other hyperlipidemia: Secondary | ICD-10-CM | POA: Diagnosis not present

## 2023-01-14 DIAGNOSIS — R7301 Impaired fasting glucose: Secondary | ICD-10-CM | POA: Diagnosis not present

## 2023-01-14 DIAGNOSIS — E0789 Other specified disorders of thyroid: Secondary | ICD-10-CM | POA: Diagnosis not present

## 2023-01-14 DIAGNOSIS — E559 Vitamin D deficiency, unspecified: Secondary | ICD-10-CM | POA: Diagnosis not present

## 2023-01-15 LAB — CBC WITH DIFFERENTIAL/PLATELET
Basophils Absolute: 0.1 10*3/uL (ref 0.0–0.2)
Basos: 1 %
EOS (ABSOLUTE): 0.2 10*3/uL (ref 0.0–0.4)
Eos: 4 %
Hematocrit: 40.1 % (ref 37.5–51.0)
Hemoglobin: 13.3 g/dL (ref 13.0–17.7)
Immature Grans (Abs): 0 10*3/uL (ref 0.0–0.1)
Immature Granulocytes: 0 %
Lymphocytes Absolute: 2.1 10*3/uL (ref 0.7–3.1)
Lymphs: 36 %
MCH: 32.4 pg (ref 26.6–33.0)
MCHC: 33.2 g/dL (ref 31.5–35.7)
MCV: 98 fL — ABNORMAL HIGH (ref 79–97)
Monocytes Absolute: 0.6 10*3/uL (ref 0.1–0.9)
Monocytes: 10 %
Neutrophils Absolute: 2.8 10*3/uL (ref 1.4–7.0)
Neutrophils: 49 %
Platelets: 290 10*3/uL (ref 150–450)
RBC: 4.1 x10E6/uL — ABNORMAL LOW (ref 4.14–5.80)
RDW: 12.9 % (ref 11.6–15.4)
WBC: 5.8 10*3/uL (ref 3.4–10.8)

## 2023-01-15 LAB — CMP14+EGFR
ALT: 16 IU/L (ref 0–44)
AST: 20 IU/L (ref 0–40)
Albumin/Globulin Ratio: 1.8 (ref 1.2–2.2)
Albumin: 4.2 g/dL (ref 3.9–4.9)
Alkaline Phosphatase: 68 IU/L (ref 44–121)
BUN/Creatinine Ratio: 13 (ref 10–24)
BUN: 12 mg/dL (ref 8–27)
Bilirubin Total: 0.2 mg/dL (ref 0.0–1.2)
CO2: 24 mmol/L (ref 20–29)
Calcium: 9.4 mg/dL (ref 8.6–10.2)
Chloride: 105 mmol/L (ref 96–106)
Creatinine, Ser: 0.93 mg/dL (ref 0.76–1.27)
Globulin, Total: 2.4 g/dL (ref 1.5–4.5)
Glucose: 94 mg/dL (ref 70–99)
Potassium: 4.8 mmol/L (ref 3.5–5.2)
Sodium: 142 mmol/L (ref 134–144)
Total Protein: 6.6 g/dL (ref 6.0–8.5)
eGFR: 89 mL/min/{1.73_m2} (ref >59)

## 2023-01-15 LAB — LIPID PANEL
Chol/HDL Ratio: 3.7 ratio (ref 0.0–5.0)
Cholesterol, Total: 151 mg/dL (ref 100–199)
HDL: 41 mg/dL (ref >39)
LDL Chol Calc (NIH): 93 mg/dL (ref 0–99)
Triglycerides: 90 mg/dL (ref 0–149)
VLDL Cholesterol Cal: 17 mg/dL (ref 5–40)

## 2023-01-15 LAB — TSH+FREE T4
Free T4: 0.96 ng/dL (ref 0.82–1.77)
TSH: 4.65 u[IU]/mL — ABNORMAL HIGH (ref 0.450–4.500)

## 2023-01-15 LAB — HEMOGLOBIN A1C
Est. average glucose Bld gHb Est-mCnc: 114 mg/dL
Hgb A1c MFr Bld: 5.6 % (ref 4.8–5.6)

## 2023-01-15 LAB — VITAMIN D 25 HYDROXY (VIT D DEFICIENCY, FRACTURES): Vit D, 25-Hydroxy: 39.3 ng/mL (ref 30.0–100.0)

## 2023-01-17 ENCOUNTER — Other Ambulatory Visit: Payer: Self-pay | Admitting: Family Medicine

## 2023-01-17 DIAGNOSIS — E063 Autoimmune thyroiditis: Secondary | ICD-10-CM

## 2023-01-31 ENCOUNTER — Encounter (HOSPITAL_COMMUNITY): Payer: Self-pay | Admitting: Psychiatry

## 2023-01-31 ENCOUNTER — Telehealth (HOSPITAL_BASED_OUTPATIENT_CLINIC_OR_DEPARTMENT_OTHER): Payer: PPO | Admitting: Psychiatry

## 2023-01-31 DIAGNOSIS — E063 Autoimmune thyroiditis: Secondary | ICD-10-CM | POA: Diagnosis not present

## 2023-01-31 DIAGNOSIS — F321 Major depressive disorder, single episode, moderate: Secondary | ICD-10-CM

## 2023-01-31 MED ORDER — BUPROPION HCL ER (XL) 300 MG PO TB24: 300.0000 mg | ORAL_TABLET | ORAL | 2 refills | Status: DC

## 2023-01-31 MED ORDER — VENLAFAXINE HCL ER 37.5 MG PO CP24: 37.5000 mg | ORAL_CAPSULE | Freq: Every day | ORAL | 2 refills | Status: DC

## 2023-01-31 NOTE — Progress Notes (Signed)
BH MD/PA/NP OP Progress Note  01/31/2023 8:27 AM Troy Allen  MRN:  [id number]  Visit Diagnosis:    ICD-10-CM   1. Current moderate episode of major depressive disorder, unspecified whether recurrent  F32.1     2. Hashimoto's disease  E06.3       Assessment:  Troy Allen is a 67 y.o. male with a history of depression, hashimoto's disease, and reported ADD who presented to [facility] at [facility] for initial evaluation on 08/13/2022.  During initial evaluation patient reported a history of depression and ADHD though the depression has been well controlled.  He reported experiencing symptoms of fatigue, low mood, amotivation, and anhedonia during periods of depression.  He had 1 instance of passive SI with no plan or intent over 20 years ago.  He denied any SI/HI or thoughts of self-harm at this time along with any history of mania, psychosis, paranoia, or delusions.  Patient also endorsed symptoms of poor concentration, increased sluggishness, fatigue, and impaired memory.  Patient felt these symptoms improved when he had taken Adderall in the past.  Of note patient has a past history of Hashimoto's disease which he is on Synthroid and TSH is currently within normal limits.  Patient met criteria for MDD currently stable and further clarity via neuropsych testing is needed to rule out ADHD.     Troy Allen presents for follow-up evaluation. Today, 01/31/23, patient reports some ups and downs over the past month.  This is partially related to some GI issues but seem to associate with him being more fatigued and lethargic some days.  He was also noted to have elevated TSH on recent blood work.  Patient was encouraged to follow-up with his PCP for both of these.  He also expressed some frustration around the ADHD testing and lack of insurance coverage.  Support was provided along with education and standards of care.  We will continue on his current regimen and  follow-up in a month.  Plan: - Continue Venlafaxine 37.5 mg QD - Continue bupropion XL 300 mg - Discontinue Adderall 30 mg BID - CMP, CBC, lipid profile, vitamin D, TSH, and free T4 reviewed - Neuropsych testing referral, completing initial paperwork - Crisis resources reviewed - Follow up in 4-6 weeks  Chief Complaint:  Chief Complaint  Patient presents with   Follow-up   HPI: Troy Allen presents reporting that the last 6 weeks have had there ups and downs.  He notes that he has been sleeping more for a little while now especially for the last few days, because he has felt seriously off.  He feels like this is related to some GI issues and indigestion after he ate a burger from a local place.  Patient notes that recently he has been experiencing the symptoms when he eats too greasy or fatty foods.  The symptoms tend to last few days and he is starting to feel better today.  Patient was encouraged to reach out to his primary to discuss this further.  He also noted that his primary did repeat blood work for talking back normal except for his TSH levels which were elevated.  She asked him to repeat the levels in 6 weeks.  We did review how thyroid levels can affect mood symptoms.  In regards to his ADHD symptoms he had completed the paperwork for [facility] attention but was then informed that his insurance would not cover it, as opposed to what he had previously been told.  He was also informed that they would charge him every 3 months for follow-up visits.  We explained that the follow-ups would not be necessary as we are only looking for the initial testing and the follow-ups were for medication prescription by provider there.  The patient wishes he could choose to follow up with them however he is also more than welcome to follow up with this provider.  Patient is frustrated by the situation but acknowledge that he would reach out to them again about doing the initial testing.  Past Psychiatric  History: Patient's psychiatric care started in 1995 first with therapy than medication.  He reports trying a number of medications but only remembers Prozac, bupropion, and Effexor in the antidepressant realm.  He has also only tried Adderall as far stimulants ago.  He denies any prior suicide attempts or psychiatric hospitalizations.  Bupropion XL 150 mg started on 08/13/22 and increased to 300 mg on 10/21/2022  Patient reports having half a glass of wine with dinner and using pot occasionally around once or twice a week.  He notes that he does smoke nicotine but wants to quit.    Past Medical History:  Past Medical History:  Diagnosis Date   Hashimoto's disease     Past Surgical History:  Procedure Laterality Date   HAND TENDON SURGERY Left     Family Psychiatric History: His brother has depression and committed suicide  Family History: No family history on file.  Social History:  Social History   Socioeconomic History   Marital status: Divorced    Spouse name: Not on file   Number of children: Not on file   Years of education: Not on file   Highest education level: Not on file  Occupational History   Not on file  Tobacco Use   Smoking status: Never    Passive exposure: Never   Smokeless tobacco: Never  Vaping Use   Vaping Use: Every day  Substance and Sexual Activity   Alcohol use: Yes    Comment: occasionally   Drug use: Yes    Types: Marijuana   Sexual activity: Yes  Other Topics Concern   Not on file  Social History Narrative   Not on file   Social Determinants of Health   Financial Resource Strain: Not on file  Food Insecurity: Not on file  Transportation Needs: No Transportation Needs (06/10/2022)   PRAPARE - Transportation    Lack of Transportation (Medical): No    Lack of Transportation (Non-Medical): No  Physical Activity: Sufficiently Active (06/10/2022)   Exercise Vital Sign    Days of Exercise per Week: 5 days    Minutes of Exercise per Session:  60 min  Stress: Not on file  Social Connections: Not on file    Allergies: No Known Allergies  Current Medications: Current Outpatient Medications  Medication Sig Dispense Refill   amphetamine-dextroamphetamine (ADDERALL) 10 MG tablet Take 3 tablets (30 mg total) by mouth 2 (two) times daily. 51 tablet 0   amphetamine-dextroamphetamine (ADDERALL) 20 MG tablet Take 1 tablet (20 mg total) by mouth 2 (two) times daily. 15 tablet 0   buPROPion (WELLBUTRIN XL) 300 MG 24 hr tablet Take 1 tablet (300 mg total) by mouth every morning. 30 tablet 2   ezetimibe (ZETIA) 10 MG tablet Take 1 tablet (10 mg total) by mouth daily. 90 tablet 3   levothyroxine (SYNTHROID) 137 MCG tablet Take 1 tablet (137 mcg total) by mouth daily before breakfast. 30 tablet 2  sildenafil (VIAGRA) 100 MG tablet Take 0.5-1 tablets (50-100 mg total) by mouth daily as needed for erectile dysfunction. 10 tablet 11   venlafaxine XR (EFFEXOR-XR) 37.5 MG 24 hr capsule Take 1 capsule (37.5 mg total) by mouth daily. 30 capsule 2   No current facility-administered medications for this visit.     Psychiatric Specialty Exam: Review of Systems  There were no vitals taken for this visit.There is no height or weight on file to calculate BMI.  General Appearance: Fairly Groomed  Eye Contact:  Good  Speech:  Clear and Coherent and Normal Rate  Volume:  Normal  Mood:  Euthymic and frustrated  Affect:  Congruent  Thought Process:  Coherent  Orientation:  Full (Time, Place, and Person)  Thought Content: Logical   Suicidal Thoughts:  No  Homicidal Thoughts:  No  Memory:  NA  Judgement:  Good  Insight:  Fair  Psychomotor Activity:  Normal  Concentration:  Concentration: Good  Recall:  Good  Fund of Knowledge: Fair  Language: Good  Akathisia:  NA    AIMS (if indicated): not done  Assets:  Communication Skills Desire for Improvement Housing Physical Health  ADL's:  Intact  Cognition: WNL  Sleep:  Good   Metabolic  Disorder Labs: Lab Results  Component Value Date   HGBA1C 5.6 01/14/2023   No results found for: "PROLACTIN" Lab Results  Component Value Date   CHOL 151 01/14/2023   TRIG 90 01/14/2023   HDL 41 01/14/2023   CHOLHDL 3.7 01/14/2023   LDLCALC 93 01/14/2023   LDLCALC 155 (H) 09/11/2022   Lab Results  Component Value Date   TSH 4.650 (H) 01/14/2023   TSH 2.040 09/11/2022    Therapeutic Level Labs: No results found for: "LITHIUM" No results found for: "VALPROATE" No results found for: "CBMZ"   Screenings: GAD-7    Flowsheet Row Office Visit from 09/04/2022 in [facility] Office Visit from 08/13/2022 in [facility]  Total GAD-7 Score 0 6      PHQ2-9    Flowsheet Row Office Visit from 09/04/2022 in [facility] Office Visit from 08/13/2022 in [facility] Office Visit from 06/05/2022 in [facility] Office Visit from 02/25/2022 in [facility]  PHQ-2 Total Score 0 0 0 2  PHQ-9 Total Score 7 -- -- 3      Flowsheet Row Office Visit from 08/13/2022 in [facility] ED from 03/31/2022 in [facility]  C-SSRS RISK CATEGORY No Risk No Risk       Collaboration of Care: Collaboration of Care: Medication Management AEB medication prescription and Primary Care Provider AEB chart review  Patient/Guardian was advised Release of Information must be obtained prior to any record release in order to collaborate their care with an outside provider. Patient/Guardian was advised if they have not already done so to contact the registration department to sign all necessary forms in order for [place] to release information regarding their care.   Consent: Patient/Guardian gives verbal consent for treatment and assignment of benefits for services  provided during this visit. Patient/Guardian expressed understanding and agreed to proceed.    Stasia Cavalier, MD 01/31/2023, 8:27 AM   Virtual Visit via Video Note  I connected with Angus Seller on 01/31/23 at  9:30 AM EDT by a video enabled telemedicine application and verified that I am speaking with  the correct person using two identifiers.  Location: Patient: Home  Provider: Home Office   I discussed the limitations of evaluation and management by telemedicine and the availability of in person appointments. The patient expressed understanding and agreed to proceed.   I discussed the assessment and treatment plan with the patient. The patient was provided an opportunity to ask questions and all were answered. The patient agreed with the plan and demonstrated an understanding of the instructions.   The patient was advised to call back or seek an in-person evaluation if the symptoms worsen or if the condition fails to improve as anticipated.  I provided 15 minutes of non-face-to-face time during this encounter.   Stasia Cavalier, MD

## 2023-02-25 ENCOUNTER — Encounter: Payer: Self-pay | Admitting: Family Medicine

## 2023-02-25 ENCOUNTER — Other Ambulatory Visit: Payer: Self-pay | Admitting: Family Medicine

## 2023-02-25 DIAGNOSIS — E063 Autoimmune thyroiditis: Secondary | ICD-10-CM

## 2023-02-25 NOTE — Telephone Encounter (Signed)
Kindly ask the patient to return for labs.

## 2023-02-26 ENCOUNTER — Other Ambulatory Visit: Payer: Self-pay | Admitting: Family Medicine

## 2023-02-26 DIAGNOSIS — E063 Autoimmune thyroiditis: Secondary | ICD-10-CM

## 2023-02-26 LAB — TSH+FREE T4
Free T4: 1.18 ng/dL (ref 0.82–1.77)
TSH: 5.47 u[IU]/mL — ABNORMAL HIGH (ref 0.450–4.500)

## 2023-02-26 MED ORDER — LEVOTHYROXINE SODIUM 150 MCG PO TABS: 150.0000 ug | ORAL_TABLET | Freq: Every day | ORAL | 1 refills | Status: DC

## 2023-02-26 NOTE — Progress Notes (Signed)
Kindly inform the patient that I have increased his Synthroid to 150 mcg to take daily on an empty stomach.  His labs indicate subclinical hypothyroidism.  I would like him to return for labs in 6 weeks to assess her thyroid levels after starting therapy on April 09, 2023

## 2023-03-13 ENCOUNTER — Encounter (HOSPITAL_COMMUNITY): Payer: Self-pay

## 2023-03-13 ENCOUNTER — Encounter (HOSPITAL_COMMUNITY): Payer: PPO | Admitting: Psychiatry

## 2023-03-13 NOTE — Progress Notes (Signed)
This encounter was created in error - please disregard.

## 2023-03-18 ENCOUNTER — Other Ambulatory Visit (HOSPITAL_COMMUNITY): Payer: Self-pay | Admitting: *Deleted

## 2023-03-18 ENCOUNTER — Encounter (HOSPITAL_COMMUNITY): Payer: Self-pay | Admitting: Psychiatry

## 2023-03-18 ENCOUNTER — Telehealth (HOSPITAL_BASED_OUTPATIENT_CLINIC_OR_DEPARTMENT_OTHER): Payer: HMO | Admitting: Psychiatry

## 2023-03-18 DIAGNOSIS — G4733 Obstructive sleep apnea (adult) (pediatric): Secondary | ICD-10-CM

## 2023-03-18 DIAGNOSIS — F321 Major depressive disorder, single episode, moderate: Secondary | ICD-10-CM

## 2023-03-18 DIAGNOSIS — E063 Autoimmune thyroiditis: Secondary | ICD-10-CM

## 2023-03-18 MED ORDER — BUPROPION HCL ER (XL) 300 MG PO TB24: 300.0000 mg | ORAL_TABLET | ORAL | 2 refills | Status: DC

## 2023-03-18 MED ORDER — VENLAFAXINE HCL ER 37.5 MG PO CP24: 37.5000 mg | ORAL_CAPSULE | Freq: Every day | ORAL | 2 refills | Status: DC

## 2023-03-18 NOTE — Progress Notes (Signed)
BH MD/PA/NP OP Progress Note  03/18/2023 11:12 AM Troy Allen  MRN:  [id number]  Visit Diagnosis:    ICD-10-CM   1. Current moderate episode of major depressive disorder, unspecified whether recurrent (HCC)  F32.1 buPROPion (WELLBUTRIN XL) 300 MG 24 hr tablet    venlafaxine XR (EFFEXOR-XR) 37.5 MG 24 hr capsule    2. Hashimoto's disease  E06.3       Assessment:  Troy Allen is a 67 y.o. male with a history of depression, hashimoto's disease, and reported ADD who presented to [facility] at [facility] for initial evaluation on 08/13/2022.  During initial evaluation patient reported a history of depression and ADHD though the depression has been well controlled.  He reported experiencing symptoms of fatigue, low mood, amotivation, and anhedonia during periods of depression.  He had 1 instance of passive SI with no plan or intent over 20 years ago.  He denied any SI/HI or thoughts of self-harm at this time along with any history of mania, psychosis, paranoia, or delusions.  Patient also endorsed symptoms of poor concentration, increased sluggishness, fatigue, and impaired memory.  Patient felt these symptoms improved when he had taken Adderall in the past.  Of note patient has a past history of Hashimoto's disease which he is on Synthroid and TSH is currently within normal limits.  Patient met criteria for MDD currently stable and further clarity via neuropsych testing is needed to rule out ADHD.     Troy Allen presents for follow-up evaluation. Today, 03/18/23, patient reports that his depressive symptoms have remained stable over the past month and he continues to struggle with poor concentration, fatigue, lethargy, and over sedation.  Of note his partner has observed him having apneic episodes during the night which raises concerns for sleep apnea.  We will refer her to a sleep specialist as this could be contributing to mood, concentration, and fatigue  symptoms.  We will continue on his current regimen and patient denies any adverse side effects at this time.  Patient also notes that he made the paperwork is awaiting a date for his neuropsych testing.  Plan: - Continue Venlafaxine 37.5 mg QD - Continue bupropion XL 300 mg - Discontinue Adderall 30 mg BID - CMP, CBC, lipid profile, vitamin D, TSH, and free T4 reviewed - Neuropsych testing referral, completing initial paperwork - Sleep specialist referral for sleep apnea - Crisis resources reviewed - Follow up in 6 weeks  Chief Complaint:  Chief Complaint  Patient presents with   Follow-up   HPI: Troy Allen presents reporting things have been going around the same over the past month.  He continues to struggle with increased fatigue and poor concentration.  Of note he reports missing his appointment last week due to becoming over sedated and taking a nap resulting in sleeping through the appointment.  Troy Allen describes this as a normal pattern for him since stopping the Adderall.  He has found that he will wake up completing tasks but then started to feel oversedated and go back to sleep.  Patient did not have this issue and on Adderall past.  He has connected with a neuropsych testing facility and submitted paperwork.  Now he is awaiting appointment date for evaluation.  Troy Allen recently was found to have elevated TSH levels leading to his levothyroxine needing to be adjusted.  In addition his partner had mentioned him snoring in the past and recently was able to report an episode where he stopped breathing during the  night.  She reports that this has been happening on multiple occasions.  We discussed the possibility of sleep apnea and suggested referral to sleep specialist which patient was open to.  Past Psychiatric History: Patient's psychiatric care started in 1995 first with therapy than medication.  He reports trying a number of medications but only remembers Prozac, bupropion, and Effexor in  the antidepressant realm.  He has also only tried Adderall as far stimulants ago.  He denies any prior suicide attempts or psychiatric hospitalizations.  Bupropion XL 150 mg started on 08/13/22 and increased to 300 mg on 10/21/2022  Patient reports having half a glass of wine with dinner and using pot occasionally around once or twice a week.  He notes that he does smoke nicotine but wants to quit.    Past Medical History:  Past Medical History:  Diagnosis Date   Hashimoto's disease     Past Surgical History:  Procedure Laterality Date   HAND TENDON SURGERY Left     Family Psychiatric History: His brother has depression and committed suicide  Family History: No family history on file.  Social History:  Social History   Socioeconomic History   Marital status: Divorced    Spouse name: Not on file   Number of children: Not on file   Years of education: Not on file   Highest education level: Not on file  Occupational History   Not on file  Tobacco Use   Smoking status: Never    Passive exposure: Never   Smokeless tobacco: Never  Vaping Use   Vaping Use: Every day  Substance and Sexual Activity   Alcohol use: Yes    Comment: occasionally   Drug use: Yes    Types: Marijuana   Sexual activity: Yes  Other Topics Concern   Not on file  Social History Narrative   Not on file   Social Determinants of Health   Financial Resource Strain: Not on file  Food Insecurity: Not on file  Transportation Needs: No Transportation Needs (06/10/2022)   PRAPARE - Transportation    Lack of Transportation (Medical): No    Lack of Transportation (Non-Medical): No  Physical Activity: Sufficiently Active (06/10/2022)   Exercise Vital Sign    Days of Exercise per Week: 5 days    Minutes of Exercise per Session: 60 min  Stress: Not on file  Social Connections: Not on file    Allergies: No Known Allergies  Current Medications: Current Outpatient Medications  Medication Sig Dispense  Refill   amphetamine-dextroamphetamine (ADDERALL) 10 MG tablet Take 3 tablets (30 mg total) by mouth 2 (two) times daily. 51 tablet 0   amphetamine-dextroamphetamine (ADDERALL) 20 MG tablet Take 1 tablet (20 mg total) by mouth 2 (two) times daily. 15 tablet 0   buPROPion (WELLBUTRIN XL) 300 MG 24 hr tablet Take 1 tablet (300 mg total) by mouth every morning. 30 tablet 2   ezetimibe (ZETIA) 10 MG tablet Take 1 tablet (10 mg total) by mouth daily. 90 tablet 3   levothyroxine (SYNTHROID) 150 MCG tablet Take 1 tablet (150 mcg total) by mouth daily. 60 tablet 1   sildenafil (VIAGRA) 100 MG tablet Take 0.5-1 tablets (50-100 mg total) by mouth daily as needed for erectile dysfunction. 10 tablet 11   venlafaxine XR (EFFEXOR-XR) 37.5 MG 24 hr capsule Take 1 capsule (37.5 mg total) by mouth daily. 30 capsule 2   No current facility-administered medications for this visit.     Psychiatric Specialty Exam: Review of  Systems  There were no vitals taken for this visit.There is no height or weight on file to calculate BMI.  General Appearance: Fairly Groomed  Eye Contact:  Good  Speech:  Clear and Coherent and Normal Rate  Volume:  Normal  Mood:  Euthymic  Affect:  Congruent  Thought Process:  Coherent  Orientation:  Full (Time, Place, and Person)  Thought Content: Logical   Suicidal Thoughts:  No  Homicidal Thoughts:  No  Memory:  NA  Judgement:  Good  Insight:  Fair  Psychomotor Activity:  Normal  Concentration:  Concentration: Good  Recall:  Good  Fund of Knowledge: Fair  Language: Good  Akathisia:  NA    AIMS (if indicated): not done  Assets:  Communication Skills Desire for Improvement Housing Physical Health  ADL's:  Intact  Cognition: WNL  Sleep:  Good   Metabolic Disorder Labs: Lab Results  Component Value Date   HGBA1C 5.6 01/14/2023   No results found for: "PROLACTIN" Lab Results  Component Value Date   CHOL 151 01/14/2023   TRIG 90 01/14/2023   HDL 41 01/14/2023    CHOLHDL 3.7 01/14/2023   LDLCALC 93 01/14/2023   LDLCALC 155 (H) 09/11/2022   Lab Results  Component Value Date   TSH 5.470 (H) 02/25/2023   TSH 4.650 (H) 01/14/2023    Therapeutic Level Labs: No results found for: "LITHIUM" No results found for: "VALPROATE" No results found for: "CBMZ"   Screenings: GAD-7    Flowsheet Row Office Visit from 09/04/2022 in [facility] Office Visit from 08/13/2022 in [facility]  Total GAD-7 Score 0 6      PHQ2-9    Flowsheet Row Office Visit from 09/04/2022 in [facility] Office Visit from 08/13/2022 in [facility] Office Visit from 06/05/2022 in [facility] Office Visit from 02/25/2022 in  [facility]  PHQ-2 Total Score 0 0 0 2  PHQ-9 Total Score 7 -- -- 3      Flowsheet Row Office Visit from 08/13/2022 in [facility] ED from 03/31/2022 in [facility] Emergency Department at [facility]  C-SSRS RISK CATEGORY No Risk No Risk       Collaboration of Care: Collaboration of Care: Medication Management AEB medication prescription  Patient/Guardian was advised Release of Information must be obtained prior to any record release in order to collaborate their care with an outside provider. Patient/Guardian was advised if they have not already done so to contact the registration department to sign all necessary forms in order for [place] to release information regarding their care.   Consent: Patient/Guardian gives verbal consent for treatment and assignment of benefits for services provided during this visit. Patient/Guardian expressed understanding and agreed to proceed.    Stasia Cavalier, MD 03/18/2023, 11:12 AM   Virtual Visit via Video Note  I connected with Troy Allen on 03/18/23 at 11:00 AM EDT by a video enabled  telemedicine application and verified that I am speaking with the correct person using two identifiers.  Location: Patient: Home  Provider: Home Office   I discussed the limitations of evaluation and management by telemedicine and the availability of in person appointments. The patient expressed understanding and agreed to proceed.   I discussed the assessment and treatment plan with the patient. The patient was provided an opportunity to ask questions and all were answered. The patient agreed with the  plan and demonstrated an understanding of the instructions.   The patient was advised to call back or seek an in-person evaluation if the symptoms worsen or if the condition fails to improve as anticipated.  I provided 15 minutes of non-face-to-face time during this encounter.   Stasia Cavalier, MD

## 2023-04-23 ENCOUNTER — Encounter: Payer: Self-pay | Admitting: Family Medicine

## 2023-04-24 DIAGNOSIS — E063 Autoimmune thyroiditis: Secondary | ICD-10-CM | POA: Diagnosis not present

## 2023-04-25 LAB — TSH+FREE T4
Free T4: 1.46 ng/dL (ref 0.82–1.77)
TSH: 1.64 u[IU]/mL (ref 0.450–4.500)

## 2023-04-28 ENCOUNTER — Other Ambulatory Visit: Payer: Self-pay | Admitting: Family Medicine

## 2023-04-30 DIAGNOSIS — F902 Attention-deficit hyperactivity disorder, combined type: Secondary | ICD-10-CM | POA: Diagnosis not present

## 2023-04-30 DIAGNOSIS — F321 Major depressive disorder, single episode, moderate: Secondary | ICD-10-CM | POA: Diagnosis not present

## 2023-05-01 ENCOUNTER — Encounter: Payer: Self-pay | Admitting: Family Medicine

## 2023-05-05 NOTE — Progress Notes (Unsigned)
BH MD/PA/NP OP Progress Note  05/06/2023 3:22 PM [name]  MRN:  [id number]  Visit Diagnosis:    ICD-10-CM   1. Attention deficit hyperactivity disorder (ADHD), combined type  F90.2 amphetamine-dextroamphetamine (ADDERALL) 30 MG tablet    amphetamine-dextroamphetamine (ADDERALL) 30 MG tablet    2. Current moderate episode of major depressive disorder, unspecified whether recurrent (HCC)  F32.1 buPROPion (WELLBUTRIN XL) 150 MG 24 hr tablet       Assessment:  Troy Allen is a 67 y.o. male with a history of depression, hashimoto's disease, and reported ADD who presented to [facility] at [facility] for initial evaluation on 08/13/2022.  During initial evaluation patient reported a history of depression and ADHD though the depression has been well controlled.  He reported experiencing symptoms of fatigue, low mood, amotivation, and anhedonia during periods of depression.  He had 1 instance of passive SI with no plan or intent over 20 years ago.  He denied any SI/HI or thoughts of self-harm at this time along with any history of mania, psychosis, paranoia, or delusions.  Patient also endorsed symptoms of poor concentration, increased sluggishness, fatigue, and impaired memory.  Patient felt these symptoms improved when he had taken Adderall in the past.  Of note patient has a past history of Hashimoto's disease which he is on Synthroid and TSH is currently within normal limits.  Patient met criteria for MDD currently stable and further clarity via neuropsych testing is needed to rule out ADHD.     Troy Allen presents for follow-up evaluation. Today, 05/06/23, patient reports that his depressive symptoms have remained stable over the past month.  He did complete his ADHD testing and was diagnosed with ADHD combined type.  We will restart on his Adderall today.  With the initiation of the Adderall the bupropion no longer is necessary for ADHD management.  We  will taper off bupropion over the next 2 weeks.  Risk and benefits of this were discussed.  Patient was referred for sleep study at his last appointment and will follow up on this.  We will also refer patient for therapy today.  Plan: - Continue Venlafaxine 37.5 mg QD - Taper bupropion XL 150 mg for 2 weeks before discontinuing the medication   - Restart Adderall 30 mg BID - CMP, CBC, lipid profile, vitamin D, TSH, and free T4 reviewed - Neuropsych testing completed and reviewed, patient to send in a copy - Sleep specialist referral for sleep apnea - Crisis resources reviewed - Therapy referral - Follow up in 6 weeks  Chief Complaint:  Chief Complaint  Patient presents with   Follow-up   HPI: Troy Allen presents reporting that he is good and his have gone fairly well for him over the past 6 weeks.  He completed the neuropsych testing last Tuesday and was diagnosed with ADHD combined type.  Patient showed a copy of his test results and will send in records to be up loaded.  During the assessment the evaluator did also recommend patient undergo sleep testing to rule out sleep apnea.  He also noted a connection to the onset of patient's symptoms to past trauma from over a decade ago where he went through a lot at once.  We reviewed this and explored patient interest in diving deeper into some of the past trauma.  He was open to this and we agreed to refer him for therapy.  With the confirm diagnosis of ADHD will restart patient's Adderall today.  As he is restarting on the Adderall the bupropion no longer seems necessary and we will taper off the medication.  Risks and benefits of both were discussed.  Past Psychiatric History: Patient's psychiatric care started in 1995 first with therapy than medication.  He reports trying a number of medications but only remembers Prozac, bupropion, and Effexor in the antidepressant realm.  He has also only tried Adderall as far stimulants ago.  He denies any prior  suicide attempts or psychiatric hospitalizations.  Bupropion XL 150 mg started on 08/13/22 and increased to 300 mg on 10/21/2022  Patient reports having half a glass of wine with dinner and using pot occasionally around once or twice a week.  He notes that he does smoke nicotine but wants to quit.    Past Medical History:  Past Medical History:  Diagnosis Date   Hashimoto's disease     Past Surgical History:  Procedure Laterality Date   HAND TENDON SURGERY Left     Family Psychiatric History: His brother has depression and committed suicide  Family History: No family history on file.  Social History:  Social History   Socioeconomic History   Marital status: Divorced    Spouse name: Not on file   Number of children: Not on file   Years of education: Not on file   Highest education level: Not on file  Occupational History   Not on file  Tobacco Use   Smoking status: Never    Passive exposure: Never   Smokeless tobacco: Never  Vaping Use   Vaping status: Every Day  Substance and Sexual Activity   Alcohol use: Yes    Comment: occasionally   Drug use: Yes    Types: Marijuana   Sexual activity: Yes  Other Topics Concern   Not on file  Social History Narrative   Not on file   Social Determinants of Health   Financial Resource Strain: Not on file  Food Insecurity: Not on file  Transportation Needs: No Transportation Needs (06/10/2022)   PRAPARE - Transportation    Lack of Transportation (Medical): No    Lack of Transportation (Non-Medical): No  Physical Activity: Sufficiently Active (06/10/2022)   Exercise Vital Sign    Days of Exercise per Week: 5 days    Minutes of Exercise per Session: 60 min  Stress: Not on file  Social Connections: Not on file    Allergies: No Known Allergies  Current Medications: Current Outpatient Medications  Medication Sig Dispense Refill   amphetamine-dextroamphetamine (ADDERALL) 30 MG tablet Take 1 tablet by mouth 2 (two) times  daily. 60 tablet 0   [START ON 06/03/2023] amphetamine-dextroamphetamine (ADDERALL) 30 MG tablet Take 1 tablet by mouth 2 (two) times daily. 60 tablet 0   buPROPion (WELLBUTRIN XL) 150 MG 24 hr tablet Take 1 tablet (150 mg total) by mouth every morning. 14 tablet 0   ezetimibe (ZETIA) 10 MG tablet Take 1 tablet (10 mg total) by mouth daily. 90 tablet 3   levothyroxine (SYNTHROID) 150 MCG tablet Take 1 tablet (150 mcg total) by mouth daily. 60 tablet 1   sildenafil (VIAGRA) 100 MG tablet Take 0.5-1 tablets (50-100 mg total) by mouth daily as needed for erectile dysfunction. 10 tablet 11   venlafaxine XR (EFFEXOR-XR) 37.5 MG 24 hr capsule Take 1 capsule (37.5 mg total) by mouth daily. 30 capsule 2   No current facility-administered medications for this visit.     Psychiatric Specialty Exam: Review of Systems  There were no vitals  taken for this visit.There is no height or weight on file to calculate BMI.  General Appearance: Fairly Groomed  Eye Contact:  Good  Speech:  Clear and Coherent and Normal Rate  Volume:  Normal  Mood:  Euthymic  Affect:  Congruent  Thought Process:  Coherent  Orientation:  Full (Time, Place, and Person)  Thought Content: Logical   Suicidal Thoughts:  No  Homicidal Thoughts:  No  Memory:  NA  Judgement:  Good  Insight:  Good  Psychomotor Activity:  Normal  Concentration:  Concentration: Good  Recall:  Good  Fund of Knowledge: Fair  Language: Good  Akathisia:  NA    AIMS (if indicated): not done  Assets:  Communication Skills Desire for Improvement Housing Physical Health  ADL's:  Intact  Cognition: WNL  Sleep:  Good   Metabolic Disorder Labs: Lab Results  Component Value Date   HGBA1C 5.6 01/14/2023   No results found for: "PROLACTIN" Lab Results  Component Value Date   CHOL 151 01/14/2023   TRIG 90 01/14/2023   HDL 41 01/14/2023   CHOLHDL 3.7 01/14/2023   LDLCALC 93 01/14/2023   LDLCALC 155 (H) 09/11/2022   Lab Results  Component  Value Date   TSH 1.640 04/24/2023   TSH 5.470 (H) 02/25/2023    Therapeutic Level Labs: No results found for: "LITHIUM" No results found for: "VALPROATE" No results found for: "CBMZ"   Screenings: GAD-7    Flowsheet Row Office Visit from 09/04/2022 in [facility] Office Visit from 08/13/2022 in [facility]  Total GAD-7 Score 0 6      PHQ2-9    Flowsheet Row Office Visit from 09/04/2022 in [facility] Office Visit from 08[facility]13/2022 [facility] Office Visit from 06/05/2022 in [facility] Office Visit from 02/25/2022 in [facility]  PHQ-2 Total Score 0 0 0 2  PHQ-9 Total Score 7 -- -- 3      Flowsheet Row Office Visit from 08/13/2022 in [facility] ED from 03/31/2022 in [facility] Emergency Department at [facility]  C-SSRS RISK CATEGORY No Risk No Risk       Collaboration of Care: Collaboration of Care: Medication Management AEB medication prescription  Patient/Guardian was advised Release of Information must be obtained prior to any record release in order to collaborate their care with an outside provider. Patient/Guardian was advised if they have not already done so to contact the registration department to sign all necessary forms in order for [place] to release information regarding their care.   Consent: Patient/Guardian gives verbal consent for treatment and assignment of benefits for services provided during this visit. Patient/Guardian expressed understanding and agreed to proceed.    Stasia Cavalier, MD 05/06/2023, 3:22 PM   Virtual Visit via Video Note  I connected with Troy Allen on 05/06/23 at  2:00 PM EDT by a video enabled telemedicine application and verified that I am speaking with the correct person using two  identifiers.  Location: Patient: Home  Provider: Home Office   I discussed the limitations of evaluation and management by telemedicine and the availability of in person appointments. The patient expressed understanding and agreed to proceed.   I discussed the assessment and treatment plan with the patient. The patient was provided an opportunity to ask questions and all were answered. The patient agreed with the plan and demonstrated an understanding of  the instructions.   The patient was advised to call back or seek an in-person evaluation if the symptoms worsen or if the condition fails to improve as anticipated.  I provided 25 minutes of non-face-to-face time during this encounter.   Stasia Cavalier, MD

## 2023-05-06 ENCOUNTER — Encounter (HOSPITAL_COMMUNITY): Payer: Self-pay | Admitting: Psychiatry

## 2023-05-06 ENCOUNTER — Telehealth (HOSPITAL_BASED_OUTPATIENT_CLINIC_OR_DEPARTMENT_OTHER): Payer: HMO | Admitting: Psychiatry

## 2023-05-06 DIAGNOSIS — F902 Attention-deficit hyperactivity disorder, combined type: Secondary | ICD-10-CM

## 2023-05-06 DIAGNOSIS — F321 Major depressive disorder, single episode, moderate: Secondary | ICD-10-CM | POA: Diagnosis not present

## 2023-05-06 MED ORDER — AMPHETAMINE-DEXTROAMPHETAMINE 30 MG PO TABS
30.0000 mg | ORAL_TABLET | Freq: Two times a day (BID) | ORAL | 0 refills | Status: DC
Start: 2023-05-06 — End: 2023-06-20

## 2023-05-06 MED ORDER — AMPHETAMINE-DEXTROAMPHETAMINE 30 MG PO TABS
30.0000 mg | ORAL_TABLET | Freq: Two times a day (BID) | ORAL | 0 refills | Status: DC
Start: 2023-06-03 — End: 2023-06-20

## 2023-05-06 MED ORDER — BUPROPION HCL ER (XL) 150 MG PO TB24
150.0000 mg | ORAL_TABLET | ORAL | 0 refills | Status: DC
Start: 2023-05-06 — End: 2023-08-22

## 2023-05-15 ENCOUNTER — Other Ambulatory Visit (HOSPITAL_COMMUNITY): Payer: Self-pay | Admitting: Psychiatry

## 2023-05-15 DIAGNOSIS — F321 Major depressive disorder, single episode, moderate: Secondary | ICD-10-CM

## 2023-05-16 ENCOUNTER — Ambulatory Visit: Payer: PPO | Admitting: Family Medicine

## 2023-05-19 ENCOUNTER — Ambulatory Visit (INDEPENDENT_AMBULATORY_CARE_PROVIDER_SITE_OTHER): Payer: HMO | Admitting: Family Medicine

## 2023-05-19 ENCOUNTER — Encounter: Payer: Self-pay | Admitting: Family Medicine

## 2023-05-19 VITALS — BP 124/74 | HR 78 | Ht 68.0 in | Wt 166.0 lb

## 2023-05-19 DIAGNOSIS — E559 Vitamin D deficiency, unspecified: Secondary | ICD-10-CM

## 2023-05-19 DIAGNOSIS — E063 Autoimmune thyroiditis: Secondary | ICD-10-CM

## 2023-05-19 DIAGNOSIS — E7849 Other hyperlipidemia: Secondary | ICD-10-CM

## 2023-05-19 DIAGNOSIS — E038 Other specified hypothyroidism: Secondary | ICD-10-CM | POA: Diagnosis not present

## 2023-05-19 DIAGNOSIS — N528 Other male erectile dysfunction: Secondary | ICD-10-CM

## 2023-05-19 DIAGNOSIS — E785 Hyperlipidemia, unspecified: Secondary | ICD-10-CM | POA: Diagnosis not present

## 2023-05-19 DIAGNOSIS — R7301 Impaired fasting glucose: Secondary | ICD-10-CM

## 2023-05-19 MED ORDER — LEVOTHYROXINE SODIUM 150 MCG PO TABS
150.0000 ug | ORAL_TABLET | Freq: Every day | ORAL | 1 refills | Status: DC
Start: 2023-05-19 — End: 2023-10-13

## 2023-05-19 MED ORDER — SILDENAFIL CITRATE 100 MG PO TABS
50.0000 mg | ORAL_TABLET | Freq: Every day | ORAL | 2 refills | Status: DC | PRN
Start: 2023-05-19 — End: 2024-01-19

## 2023-05-19 NOTE — Assessment & Plan Note (Signed)
He takes Synthroid 150 mcg daily Reports increased energy, and concentration since resuming therapy on Adderall 30 mg daily Refill sent to the pharmacy Pending thyroid levels Lab Results  Component Value Date   TSH 1.640 04/24/2023

## 2023-05-19 NOTE — Assessment & Plan Note (Addendum)
He takes ezetimibe 10 mg daily Encouraged to Increase his intake of foods rich in fruits, vegetables, whole grains Lean proteins: chicken, fish, beans, legumes Low Fat dairy products Reduced intake of saturated fats, trans fatty acids, cholesterol Aim to be active at least 5 days a week for 30 minutes each day ( walking briskly)  Pending lipid panel Lab Results  Component Value Date   CHOL 151 01/14/2023   HDL 41 01/14/2023   LDLCALC 93 01/14/2023   TRIG 90 01/14/2023   CHOLHDL 3.7 01/14/2023

## 2023-05-19 NOTE — Progress Notes (Signed)
Established Patient Office Visit  Subjective:  Patient ID: Troy Allen, male    DOB: June 05, 1956  Age: 67 y.o. MRN: [id number]  CC:  Chief Complaint  Patient presents with   Care Management    4 month f/u    HPI Troy Allen is a 67 y.o. male with past medical history of ADD.  Depression, Hashimoto's disease, hyperlipidemia, and erectile dysfunction presents for f/u of  chronic medical conditions. For the details of today's visit, please refer to the assessment and plan.     Past Medical History:  Diagnosis Date   Hashimoto's disease     Past Surgical History:  Procedure Laterality Date   HAND TENDON SURGERY Left     History reviewed. No pertinent family history.  Social History   Socioeconomic History   Marital status: Divorced    Spouse name: Not on file   Number of children: Not on file   Years of education: Not on file   Highest education level: Not on file  Occupational History   Not on file  Tobacco Use   Smoking status: Never    Passive exposure: Never   Smokeless tobacco: Never  Vaping Use   Vaping status: Every Day  Substance and Sexual Activity   Alcohol use: Yes    Comment: occasionally   Drug use: Yes    Types: Marijuana   Sexual activity: Yes  Other Topics Concern   Not on file  Social History Narrative   Not on file   Social Determinants of Health   Financial Resource Strain: Not on file  Food Insecurity: Not on file  Transportation Needs: No Transportation Needs (06/10/2022)   PRAPARE - Transportation    Lack of Transportation (Medical): No    Lack of Transportation (Non-Medical): No  Physical Activity: Sufficiently Active (06/10/2022)   Exercise Vital Sign    Days of Exercise per Week: 5 days    Minutes of Exercise per Session: 60 min  Stress: Not on file  Social Connections: Not on file  Intimate Partner Violence: Not on file    Outpatient Medications Prior to Visit  Medication Sig Dispense Refill    amphetamine-dextroamphetamine (ADDERALL) 30 MG tablet Take 1 tablet by mouth 2 (two) times daily. 60 tablet 0   [START ON 06/03/2023] amphetamine-dextroamphetamine (ADDERALL) 30 MG tablet Take 1 tablet by mouth 2 (two) times daily. 60 tablet 0   buPROPion (WELLBUTRIN XL) 150 MG 24 hr tablet Take 1 tablet (150 mg total) by mouth every morning. 14 tablet 0   ezetimibe (ZETIA) 10 MG tablet Take 1 tablet (10 mg total) by mouth daily. 90 tablet 3   venlafaxine XR (EFFEXOR-XR) 37.5 MG 24 hr capsule Take 1 capsule (37.5 mg total) by mouth daily. 30 capsule 2   levothyroxine (SYNTHROID) 150 MCG tablet Take 1 tablet (150 mcg total) by mouth daily. 60 tablet 1   sildenafil (VIAGRA) 100 MG tablet Take 0.5-1 tablets (50-100 mg total) by mouth daily as needed for erectile dysfunction. 10 tablet 11   No facility-administered medications prior to visit.    No Known Allergies  ROS Review of Systems  Constitutional:  Negative for fatigue and fever.  Eyes:  Negative for visual disturbance.  Respiratory:  Negative for chest tightness and shortness of breath.   Cardiovascular:  Negative for chest pain and palpitations.  Neurological:  Negative for dizziness and headaches.      Objective:    Physical Exam HENT:     Head: Normocephalic.  Right Ear: External ear normal.     Left Ear: External ear normal.     Nose: No congestion or rhinorrhea.     Mouth/Throat:     Mouth: Mucous membranes are moist.  Cardiovascular:     Rate and Rhythm: Regular rhythm.     Heart sounds: No murmur heard. Pulmonary:     Effort: No respiratory distress.     Breath sounds: Normal breath sounds.  Neurological:     Mental Status: He is alert.     BP 124/74   Pulse 78   Ht 5\' 8"  (1.727 m)   Wt 166 lb (75.3 kg)   SpO2 96%   BMI 25.24 kg/m  Wt Readings from Last 3 Encounters:  05/19/23 166 lb (75.3 kg)  01/10/23 165 lb 1.3 oz (74.9 kg)  10/04/22 160 lb (72.6 kg)    Lab Results  Component Value Date   TSH  1.640 04/24/2023   Lab Results  Component Value Date   WBC 5.8 01/14/2023   HGB 13.3 01/14/2023   HCT 40.1 01/14/2023   MCV 98 (H) 01/14/2023   PLT 290 01/14/2023   Lab Results  Component Value Date   NA 142 01/14/2023   K 4.8 01/14/2023   CO2 24 01/14/2023   GLUCOSE 94 01/14/2023   BUN 12 01/14/2023   CREATININE 0.93 01/14/2023   BILITOT 0.2 01/14/2023   ALKPHOS 68 01/14/2023   AST 20 01/14/2023   ALT 16 01/14/2023   PROT 6.6 01/14/2023   ALBUMIN 4.2 01/14/2023   CALCIUM 9.4 01/14/2023   EGFR 89 01/14/2023   Lab Results  Component Value Date   CHOL 151 01/14/2023   Lab Results  Component Value Date   HDL 41 01/14/2023   Lab Results  Component Value Date   LDLCALC 93 01/14/2023   Lab Results  Component Value Date   TRIG 90 01/14/2023   Lab Results  Component Value Date   CHOLHDL 3.7 01/14/2023   Lab Results  Component Value Date   HGBA1C 5.6 01/14/2023      Assessment & Plan:  Hashimoto's disease Assessment & Plan: He takes Synthroid 150 mcg daily Reports increased energy, and concentration since resuming therapy on Adderall 30 mg daily Refill sent to the pharmacy Pending thyroid levels Lab Results  Component Value Date   TSH 1.640 04/24/2023     Orders: -     Levothyroxine Sodium; Take 1 tablet (150 mcg total) by mouth daily.  Dispense: 60 tablet; Refill: 1  Hyperlipidemia LDL goal <100 Assessment & Plan: He takes ezetimibe 10 mg daily Encouraged to Increase his intake of foods rich in fruits, vegetables, whole grains Lean proteins: chicken, fish, beans, legumes Low Fat dairy products Reduced intake of saturated fats, trans fatty acids, cholesterol Aim to be active at least 5 days a week for 30 minutes each day ( walking briskly)  Pending lipid panel Lab Results  Component Value Date   CHOL 151 01/14/2023   HDL 41 01/14/2023   LDLCALC 93 01/14/2023   TRIG 90 01/14/2023   CHOLHDL 3.7 01/14/2023      Other male erectile  dysfunction Assessment & Plan: Refill sildenafil 50-100 mg  as needed  Encouraged to take more than 1 dose in 24 hours Advised to take 30 minutes to an hour before sexual intercourse   Orders: -     Sildenafil Citrate; Take 0.5-1 tablets (50-100 mg total) by mouth daily as needed for erectile dysfunction.  Dispense: 10 tablet; Refill:  2  IFG (impaired fasting glucose) -     Hemoglobin A1c  Vitamin D deficiency -     VITAMIN D 25 Hydroxy (Vit-D Deficiency, Fractures)  Other specified hypothyroidism -     TSH + free T4  Other hyperlipidemia -     Lipid panel -     CMP14+EGFR -     CBC with Differential/Platelet  Note: This chart has been completed using Engineer, civil (consulting) software, and while attempts have been made to ensure accuracy, certain words and phrases may not be transcribed as intended.    Follow-up: Return in about 4 months (around 09/18/2023).   Gilmore Laroche, FNP

## 2023-05-19 NOTE — Patient Instructions (Addendum)
I appreciate the opportunity to provide care to you today!    Follow up:  4 months  Labs: please stop by the lab during the week  to get your blood drawn (CBC, CMP, TSH, Lipid profile, HgA1c, Vit D)   Attached with your AVS, you will find valuable resources for self-education. I highly recommend dedicating some time to thoroughly examine them.   Please continue to a heart-healthy diet and increase your physical activities. Try to exercise for at least five days a week.    It was a pleasure to see you and I look forward to continuing to work together on your health and well-being. Please do not hesitate to call the office if you need care or have questions about your care.  In case of emergency, please visit the Emergency Department for urgent care, or contact our clinic at [phone] to schedule an appointment. We're here to help you!   Have a wonderful day and week. With Gratitude, Gilmore Laroche MSN, FNP-BC

## 2023-05-19 NOTE — Assessment & Plan Note (Addendum)
Refill sildenafil 50-100 mg  as needed  Encouraged to take more than 1 dose in 24 hours Advised to take 30 minutes to an hour before sexual intercourse

## 2023-06-03 DIAGNOSIS — E038 Other specified hypothyroidism: Secondary | ICD-10-CM | POA: Diagnosis not present

## 2023-06-03 DIAGNOSIS — E559 Vitamin D deficiency, unspecified: Secondary | ICD-10-CM | POA: Diagnosis not present

## 2023-06-03 DIAGNOSIS — E7849 Other hyperlipidemia: Secondary | ICD-10-CM | POA: Diagnosis not present

## 2023-06-03 DIAGNOSIS — R7301 Impaired fasting glucose: Secondary | ICD-10-CM | POA: Diagnosis not present

## 2023-06-04 ENCOUNTER — Encounter: Payer: Self-pay | Admitting: Family Medicine

## 2023-06-04 LAB — CMP14+EGFR
ALT: 15 IU/L (ref 0–44)
Albumin: 4.3 g/dL (ref 3.9–4.9)
BUN/Creatinine Ratio: 12 (ref 10–24)
Bilirubin Total: 0.3 mg/dL (ref 0.0–1.2)
Chloride: 104 mmol/L (ref 96–106)

## 2023-06-04 LAB — TSH+FREE T4: TSH: 0.431 u[IU]/mL — ABNORMAL LOW (ref 0.450–4.500)

## 2023-06-04 LAB — CBC WITH DIFFERENTIAL/PLATELET: Monocytes: 9 %

## 2023-06-05 ENCOUNTER — Other Ambulatory Visit: Payer: Self-pay | Admitting: Family Medicine

## 2023-06-05 DIAGNOSIS — L237 Allergic contact dermatitis due to plants, except food: Secondary | ICD-10-CM

## 2023-06-05 MED ORDER — TRIAMCINOLONE ACETONIDE 0.5 % EX OINT
1.0000 | TOPICAL_OINTMENT | Freq: Two times a day (BID) | CUTANEOUS | 0 refills | Status: AC
Start: 2023-06-05 — End: ?

## 2023-06-20 ENCOUNTER — Encounter (HOSPITAL_COMMUNITY): Payer: Self-pay

## 2023-06-20 ENCOUNTER — Encounter (HOSPITAL_COMMUNITY): Payer: Self-pay | Admitting: Psychiatry

## 2023-06-20 ENCOUNTER — Telehealth (HOSPITAL_BASED_OUTPATIENT_CLINIC_OR_DEPARTMENT_OTHER): Payer: HMO | Admitting: Psychiatry

## 2023-06-20 DIAGNOSIS — F321 Major depressive disorder, single episode, moderate: Secondary | ICD-10-CM

## 2023-06-20 DIAGNOSIS — F902 Attention-deficit hyperactivity disorder, combined type: Secondary | ICD-10-CM

## 2023-06-20 DIAGNOSIS — E063 Autoimmune thyroiditis: Secondary | ICD-10-CM

## 2023-06-20 MED ORDER — AMPHETAMINE-DEXTROAMPHETAMINE 30 MG PO TABS
30.0000 mg | ORAL_TABLET | Freq: Two times a day (BID) | ORAL | 0 refills | Status: DC
Start: 2023-07-07 — End: 2023-08-22

## 2023-06-20 MED ORDER — AMPHETAMINE-DEXTROAMPHETAMINE 30 MG PO TABS
30.0000 mg | ORAL_TABLET | Freq: Two times a day (BID) | ORAL | 0 refills | Status: DC
Start: 2023-08-07 — End: 2023-08-22

## 2023-06-20 MED ORDER — VENLAFAXINE HCL ER 37.5 MG PO CP24
37.5000 mg | ORAL_CAPSULE | Freq: Every day | ORAL | 0 refills | Status: DC
Start: 2023-06-20 — End: 2023-08-22

## 2023-06-20 NOTE — Progress Notes (Signed)
BH MD/PA/NP OP Progress Note  06/20/2023 8:27 AM Troy Allen  MRN:  [id number]  Visit Diagnosis:    ICD-10-CM   1. Attention deficit hyperactivity disorder (ADHD), combined type  F90.2     2. Current moderate episode of major depressive disorder, unspecified whether recurrent (HCC)  F32.1     3. Hashimoto's disease  E06.3         Assessment:  Troy Allen is a 67 y.o. male with a history of depression, hashimoto's disease, and reported ADD who presented to [facility] at [facility] for initial evaluation on 08/13/2022.  During initial evaluation patient reported a history of depression and ADHD though the depression has been well controlled.  He reported experiencing symptoms of fatigue, low mood, amotivation, and anhedonia during periods of depression.  He had 1 instance of passive SI with no plan or intent over 20 years ago.  He denied any SI/HI or thoughts of self-harm at this time along with any history of mania, psychosis, paranoia, or delusions.  Patient also endorsed symptoms of poor concentration, increased sluggishness, fatigue, and impaired memory.  Patient felt these symptoms improved when he had taken Adderall in the past.  Of note patient has a past history of Hashimoto's disease which he is on Synthroid and TSH is currently within normal limits.  Patient met criteria for MDD currently stable and further clarity via neuropsych testing is needed to rule out ADHD.     Troy Allen presents for follow-up evaluation. Today, 06/20/23, patient reports an improvement in attention and focus since restarting Adderall.  He has noticed some increased insomnia though this has improved by taking the second dose before 2 PM.  Of note patient has had some increased tearfulness since starting Adderall and so bupropion.  It is likely bupropion was having a greater effect on depression than initially noted.  We will restart bupropion today.  Patient will follow up  in 2 months.  Plan: - Continue Venlafaxine 37.5 mg QD - Restart Bupropion XL 150 mg daily   - Continue Adderall 30 mg BID - CMP, CBC, lipid profile, vitamin D, TSH, and free T4 reviewed - Neuropsych testing completed and reviewed, patient to send in a copy - Sleep specialist referral for sleep apnea - Crisis resources reviewed - Therapy referral - Insurance letter completed - Follow up in 6 weeks  Chief Complaint:  Chief Complaint  Patient presents with   Follow-up   HPI: Troy Allen presents reporting that things have been going better since restarting on the Adderall. Troy Allen has been working out when to take the medication, particularly the second dose as he found he was not sleeping well. He had been taking the second dose around 4 pm. It has gotten a bit better since changing his dose to 1-2 pm. Patient denied any other notable side effects from the medication.  Troy Allen did report that he has been feeling a bit more weepy lately since starting adderall and discontinuing bupropion.  He wonders if bupropion was doing a bit more for mood then he initially thought.  We did review the side effects of the Adderall and while it could make him more irritable or anxious is unlikely to make him more depressed.  In that sense it is likely that bupropion is doing more than initially thought.  We suggested restarting the 150 mg dose which patient was in agreement with.  In regards to therapy patient reports he has not yet reached out to the  provider list he was given.  Patient also is in the process of trying to get his neuropsych testing reimbursed.  Agreed to write a letter explaining why he was referred for neuropsych testing and its importance.  Past Psychiatric History: Patient's psychiatric care started in 1995 first with therapy than medication.  He reports trying a number of medications but only remembers Prozac, bupropion, and Effexor in the antidepressant realm.  He has also only tried Adderall  as far stimulants ago.  He denies any prior suicide attempts or psychiatric hospitalizations.  Bupropion XL 150 mg started on 08/13/22 and increased to 300 mg on 10/21/2022  Patient reports having half a glass of wine with dinner and using pot occasionally around once or twice a week.  He notes that he does smoke nicotine but wants to quit.    Past Medical History:  Past Medical History:  Diagnosis Date   Hashimoto's disease     Past Surgical History:  Procedure Laterality Date   HAND TENDON SURGERY Left     Family Psychiatric History: His brother has depression and committed suicide  Family History: No family history on file.  Social History:  Social History   Socioeconomic History   Marital status: Divorced    Spouse name: Not on file   Number of children: Not on file   Years of education: Not on file   Highest education level: Not on file  Occupational History   Not on file  Tobacco Use   Smoking status: Never    Passive exposure: Never   Smokeless tobacco: Never  Vaping Use   Vaping status: Every Day  Substance and Sexual Activity   Alcohol use: Yes    Comment: occasionally   Drug use: Yes    Types: Marijuana   Sexual activity: Yes  Other Topics Concern   Not on file  Social History Narrative   Not on file   Social Determinants of Health   Financial Resource Strain: Not on file  Food Insecurity: Not on file  Transportation Needs: No Transportation Needs (06/10/2022)   PRAPARE - Transportation    Lack of Transportation (Medical): No    Lack of Transportation (Non-Medical): No  Physical Activity: Sufficiently Active (06/10/2022)   Exercise Vital Sign    Days of Exercise per Week: 5 days    Minutes of Exercise per Session: 60 min  Stress: Not on file  Social Connections: Not on file    Allergies: No Known Allergies  Current Medications: Current Outpatient Medications  Medication Sig Dispense Refill   triamcinolone ointment (KENALOG) 0.5 % Apply 1  Application topically 2 (two) times daily. 30 g 0   amphetamine-dextroamphetamine (ADDERALL) 30 MG tablet Take 1 tablet by mouth 2 (two) times daily. 60 tablet 0   amphetamine-dextroamphetamine (ADDERALL) 30 MG tablet Take 1 tablet by mouth 2 (two) times daily. 60 tablet 0   buPROPion (WELLBUTRIN XL) 150 MG 24 hr tablet Take 1 tablet (150 mg total) by mouth every morning. 14 tablet 0   ezetimibe (ZETIA) 10 MG tablet Take 1 tablet (10 mg total) by mouth daily. 90 tablet 3   levothyroxine (SYNTHROID) 150 MCG tablet Take 1 tablet (150 mcg total) by mouth daily. 60 tablet 1   sildenafil (VIAGRA) 100 MG tablet Take 0.5-1 tablets (50-100 mg total) by mouth daily as needed for erectile dysfunction. 10 tablet 2   venlafaxine XR (EFFEXOR-XR) 37.5 MG 24 hr capsule Take 1 capsule (37.5 mg total) by mouth daily. 30 capsule 2  No current facility-administered medications for this visit.     Psychiatric Specialty Exam: Review of Systems  There were no vitals taken for this visit.There is no height or weight on file to calculate BMI.  General Appearance: Fairly Groomed  Eye Contact:  Good  Speech:  Clear and Coherent and Normal Rate  Volume:  Normal  Mood:  Euthymic and intermittent tearfulness  Affect:  Congruent  Thought Process:  Coherent  Orientation:  Full (Time, Place, and Person)  Thought Content: Logical   Suicidal Thoughts:  No  Homicidal Thoughts:  No  Memory:  NA  Judgement:  Good  Insight:  Good  Psychomotor Activity:  Normal  Concentration:  Concentration: Good  Recall:  Good  Fund of Knowledge: Fair  Language: Good  Akathisia:  NA    AIMS (if indicated): not done  Assets:  Communication Skills Desire for Improvement Housing Physical Health  ADL's:  Intact  Cognition: WNL  Sleep:  Good   Metabolic Disorder Labs: Lab Results  Component Value Date   HGBA1C 5.6 06/03/2023   No results found for: "PROLACTIN" Lab Results  Component Value Date   CHOL 161 06/03/2023    TRIG 105 06/03/2023   HDL 37 (L) 06/03/2023   CHOLHDL 4.4 06/03/2023   LDLCALC 105 (H) 06/03/2023   LDLCALC 93 01/14/2023   Lab Results  Component Value Date   TSH 0.431 (L) 06/03/2023   TSH 1.640 04/24/2023    Therapeutic Level Labs: No results found for: "LITHIUM" No results found for: "VALPROATE" No results found for: "CBMZ"   Screenings: GAD-7    Flowsheet Row Office Visit from 05/19/2023 in [facility] Office Visit from 09/04/2022 in [facility] Office Visit from 08/13/2022 in [facility]  Total GAD-7 Score 0 0 6      PHQ2-9    Flowsheet Row Office Visit from 05/19/2023 in [facility] Office Visit from 09/04/2022 in [facility] Office Visit from 08/13/2022 in [facility] Office Visit from 06/05/2022 in [facility] Office Visit from 02/25/2022 in [facility]  PHQ-2 Total Score 1 0 0 0 2  PHQ-9 Total Score 6 7 -- -- 3      Flowsheet Row Office Visit from 08/13/2022 in [facility] ED from 03/31/2022 in [facility] Emergency Department at [facility]  C-SSRS RISK CATEGORY No Risk No Risk       Collaboration of Care: Collaboration of Care: Medication Management AEB medication prescription and Primary Care Provider AEB chart review  Patient/Guardian was advised Release of Information must be obtained prior to any record release in order to collaborate their care with an outside provider. Patient/Guardian was advised if they have not already done so to contact the registration department to sign all necessary forms in order for [place] to release information regarding their care.   Consent: Patient/Guardian gives verbal consent for treatment and assignment of benefits for services provided during this visit.  Patient/Guardian expressed understanding and agreed to proceed.    Stasia Cavalier, MD 06/20/2023, 8:27 AM   Virtual Visit via Video Note  I connected with Angus Seller on 06/20/23 at 10:00 AM EDT by a video enabled telemedicine application and verified that I am speaking with the correct person using two identifiers.  Location: Patient: Home  Provider: Home Office   I discussed the limitations of evaluation  and management by telemedicine and the availability of in person appointments. The patient expressed understanding and agreed to proceed.   I discussed the assessment and treatment plan with the patient. The patient was provided an opportunity to ask questions and all were answered. The patient agreed with the plan and demonstrated an understanding of the instructions.   The patient was advised to call back or seek an in-person evaluation if the symptoms worsen or if the condition fails to improve as anticipated.  I provided 25 minutes of non-face-to-face time during this encounter.   Stasia Cavalier, MD

## 2023-07-07 ENCOUNTER — Ambulatory Visit: Payer: PPO

## 2023-07-09 ENCOUNTER — Ambulatory Visit: Payer: HMO

## 2023-07-09 VITALS — BP 108/67 | Resp 16 | Ht 68.0 in | Wt 167.0 lb

## 2023-07-09 DIAGNOSIS — Z23 Encounter for immunization: Secondary | ICD-10-CM | POA: Diagnosis not present

## 2023-07-09 DIAGNOSIS — Z0000 Encounter for general adult medical examination without abnormal findings: Secondary | ICD-10-CM | POA: Diagnosis not present

## 2023-07-09 NOTE — Progress Notes (Cosign Needed Addendum)
Subjective:   Troy Allen is a 67 y.o. male who presents for Medicare Annual/Subsequent preventive examination.  Visit Complete: In person  Patient Medicare AWV questionnaire was completed by the patient on 07/09/23; I have confirmed that all information answered by patient is correct and no changes since this date.        Objective:    Today's Vitals   07/09/23 1108  BP: 108/67  Resp: 16  Weight: 167 lb (75.8 kg)  Height: 5\' 8"  (1.727 m)  PainSc: 0-No pain   Body mass index is 25.39 kg/m.     07/09/2023   11:33 AM 06/10/2022   12:33 PM 03/31/2022   11:50 AM  Advanced Directives  Does Patient Have a Medical Advance Directive? No No No  Would patient like information on creating a medical advance directive? Yes (ED - Information included in AVS) Yes (ED - Information included in AVS)     Current Medications (verified) Outpatient Encounter Medications as of 07/09/2023  Medication Sig   [START ON 08/07/2023] amphetamine-dextroamphetamine (ADDERALL) 30 MG tablet Take 1 tablet by mouth 2 (two) times daily.   amphetamine-dextroamphetamine (ADDERALL) 30 MG tablet Take 1 tablet by mouth 2 (two) times daily.   buPROPion (WELLBUTRIN XL) 150 MG 24 hr tablet Take 1 tablet (150 mg total) by mouth every morning.   ezetimibe (ZETIA) 10 MG tablet Take 1 tablet (10 mg total) by mouth daily.   levothyroxine (SYNTHROID) 150 MCG tablet Take 1 tablet (150 mcg total) by mouth daily.   sildenafil (VIAGRA) 100 MG tablet Take 0.5-1 tablets (50-100 mg total) by mouth daily as needed for erectile dysfunction.   triamcinolone ointment (KENALOG) 0.5 % Apply 1 Application topically 2 (two) times daily.   venlafaxine XR (EFFEXOR-XR) 37.5 MG 24 hr capsule Take 1 capsule (37.5 mg total) by mouth daily.   No facility-administered encounter medications on file as of 07/09/2023.    Allergies (verified) Patient has no known allergies.   History: Past Medical History:  Diagnosis Date    Hashimoto's disease    Past Surgical History:  Procedure Laterality Date   HAND TENDON SURGERY Left    History reviewed. No pertinent family history. Social History   Socioeconomic History   Marital status: Divorced    Spouse name: Not on file   Number of children: Not on file   Years of education: Not on file   Highest education level: Not on file  Occupational History   Not on file  Tobacco Use   Smoking status: Never    Passive exposure: Never   Smokeless tobacco: Never  Vaping Use   Vaping status: Every Day  Substance and Sexual Activity   Alcohol use: Yes    Comment: occasionally   Drug use: Yes    Types: Marijuana   Sexual activity: Yes  Other Topics Concern   Not on file  Social History Narrative   Not on file   Social Determinants of Health   Financial Resource Strain: Low Risk  (07/09/2023)   Overall Financial Resource Strain (CARDIA)    Difficulty of Paying Living Expenses: Not hard at all  Food Insecurity: No Food Insecurity (07/09/2023)   Hunger Vital Sign    Worried About Running Out of Food in the Last Year: Never true    Ran Out of Food in the Last Year: Never true  Transportation Needs: No Transportation Needs (07/09/2023)   PRAPARE - Administrator, Civil Service (Medical): No  Lack of Transportation (Non-Medical): No  Physical Activity: Sufficiently Active (07/09/2023)   Exercise Vital Sign    Days of Exercise per Week: 7 days    Minutes of Exercise per Session: 30 min  Stress: No Stress Concern Present (07/09/2023)   [employer] of Occupational Health - Occupational Stress Questionnaire    Feeling of Stress : Not at all  Social Connections: Socially Isolated (07/09/2023)   Social Connection and Isolation Panel [NHANES]    Frequency of Communication with Friends and Family: Once a week    Frequency of Social Gatherings with Friends and Family: Once a week    Attends Religious Services: Never    Database administrator or  Organizations: No    Attends Engineer, structural: Never    Marital Status: Divorced    Tobacco Counseling Counseling given: Not Answered   Clinical Intake:  Pre-visit preparation completed: Yes  Pain : No/denies pain Pain Score: 0-No pain     Nutritional Status: BMI of 19-24  Normal Diabetes: No  How often do you need to have someone help you when you read instructions, pamphlets, or other written materials from your doctor or pharmacy?: 1 - Never  Interpreter Needed?: No      Activities of Daily Living    07/09/2023   11:10 AM  In your present state of health, do you have any difficulty performing the following activities:  Hearing? 0  Vision? 0  Difficulty concentrating or making decisions? 0  Walking or climbing stairs? 0  Dressing or bathing? 0  Doing errands, shopping? 0    Patient Care Team: Gilmore Laroche, FNP as PCP - General (Family Medicine)  Indicate any recent Medical Services you may have received from other than [facility] providers in the past year (date may be approximate).     Assessment:   This is a routine wellness examination for Troy Allen.  Hearing/Vision screen No results found.   Goals Addressed             This Visit's Progress    Have 3 meals a day   On track    Prevent falls   On track      Depression Screen    07/09/2023   11:11 AM 05/19/2023   11:16 AM 09/04/2022   10:50 AM 08/13/2022    3:54 PM 06/05/2022   11:35 AM 02/25/2022    1:47 PM  PHQ 2/9 Scores  PHQ - 2 Score 0 1 0  0 2  PHQ- 9 Score  6 7   3      Information is confidential and restricted. Go to Review Flowsheets to unlock data.    Fall Risk    07/09/2023   11:10 AM 01/10/2023   11:17 AM 09/04/2022   10:50 AM 06/05/2022   11:35 AM 02/25/2022    1:47 PM  Fall Risk   Falls in the past year? 0 0 0 0 0  Number falls in past yr: 0 0 0 0 0  Injury with Fall?  0 0 0 0  Risk for fall due to :  No Fall Risks No Fall Risks No Fall Risks No Fall Risks   Follow up  Falls evaluation completed Falls evaluation completed Falls evaluation completed Falls evaluation completed    MEDICARE RISK AT HOME: Medicare Risk at Home Any stairs in or around the home?: No If so, are there any without handrails?: No Home free of loose throw rugs in walkways, pet beds, electrical cords, etc?:  Yes Adequate lighting in your home to reduce risk of falls?: Yes Life alert?: No Use of a cane, walker or w/c?: No Grab bars in the bathroom?: No Shower chair or bench in shower?: No Elevated toilet seat or a handicapped toilet?: No  TIMED UP AND GO:  Was the test performed?  Yes  Length of time to ambulate 10 feet: 5 sec Gait steady and fast without use of assistive device    Cognitive Function:        07/09/2023   11:11 AM  6CIT Screen  What Year? 0 points  What month? 0 points  What time? 0 points  Count back from 20 0 points  Months in reverse 0 points  Repeat phrase 0 points  Total Score 0 points    Immunizations Immunization History  Administered Date(s) Administered   Fluad Quad(high Dose 65+) 09/04/2022   PNEUMOCOCCAL CONJUGATE-20 09/04/2022   Tdap 03/31/2022    TDAP status: Up to date  Flu Vaccine status: Completed at today's visit  Pneumococcal vaccine status: Up to date  Covid-19 vaccine status: Information provided on how to obtain vaccines.   Qualifies for Shingles Vaccine? Yes   Zostavax completed No   Shingrix #1- no  Screening Tests Health Maintenance  Topic Date Due   Zoster Vaccines- Shingrix (1 of 2) Never done   INFLUENZA VACCINE  05/15/2023   COVID-19 Vaccine (1 - 2023-24 season) Never done   Medicare Annual Wellness (AWV)  07/08/2024   Fecal DNA (Cologuard)  03/07/2025   DTaP/Tdap/Td (2 - Td or Tdap) 03/31/2032   Pneumonia Vaccine 84+ Years old  Completed   Hepatitis C Screening  Completed   HPV VACCINES  Aged Out    Health Maintenance  Health Maintenance Due  Topic Date Due   Zoster Vaccines-  Shingrix (1 of 2) Never done   INFLUENZA VACCINE  05/15/2023   COVID-19 Vaccine (1 - 2023-24 season) Never done    Colorectal cancer screening: Type of screening: Cologuard. Completed 2023. Repeat every 3 years  Lung Cancer Screening: (Low Dose CT Chest recommended if Age 22-80 years, 20 pack-year currently smoking OR have quit w/in 15years.) does not qualify.   Lung Cancer Screening Referral: does not qualify  Additional Screening:  Hepatitis C Screening: does qualify; Completed yes  Vision Screening: Recommended annual ophthalmology exams for early detection of glaucoma and other disorders of the eye. Is the patient up to date with their annual eye exam?  Yes  Who is the provider or what is the name of the office in which the patient attends annual eye exams? [facility] If pt is not established with a provider, would they like to be referred to a provider to establish care? No .   Dental Screening: Recommended annual dental exams for proper oral hygiene    Community Resource Referral / Chronic Care Management: CRR required this visit?  No   CCM required this visit?  No     Plan:     I have personally reviewed and noted the following in the patient's chart:   Medical and social history Use of alcohol, tobacco or illicit drugs  Current medications and supplements including opioid prescriptions. Patient is not currently taking opioid prescriptions. Functional ability and status Nutritional status Physical activity Advanced directives List of other physicians Hospitalizations, surgeries, and ER visits in previous 12 months Vitals Screenings to include cognitive, depression, and falls Referrals and appointments  In addition, I have reviewed and discussed with patient certain preventive protocols,  quality metrics, and best practice recommendations. A written personalized care plan for preventive services as well as general preventive health recommendations were provided to  patient.     Abner Greenspan, LPN   01/20/8118   After Visit Summary: (In Person-Printed) AVS printed and given to the patient  Nurse Notes:

## 2023-07-09 NOTE — Patient Instructions (Signed)
Troy Allen , Thank you for taking time to come for your Medicare Wellness Visit. I appreciate your ongoing commitment to your health goals. Please review the following plan we discussed and let me know if I can assist you in the future.   Referrals/Orders/Follow-Ups/Clinician Recommendations: None  This is a list of the screening recommended for you and due dates:  Health Maintenance  Topic Date Due   Zoster (Shingles) Vaccine (1 of 2) Never done   Flu Shot  05/15/2023   COVID-19 Vaccine (1 - 2023-24 season) Never done   Medicare Annual Wellness Visit  07/08/2024   Cologuard (Stool DNA test)  03/07/2025   DTaP/Tdap/Td vaccine (2 - Td or Tdap) 03/31/2032   Pneumonia Vaccine  Completed   Hepatitis C Screening  Completed   HPV Vaccine  Aged Out    Advanced directives: (Provided) Advance directive discussed with you today. I have provided a copy for you to complete at home and have notarized. Once this is complete, please bring a copy in to our office so we can scan it into your chart.   Next Medicare Annual Wellness Visit scheduled for next year: Yes

## 2023-08-18 NOTE — Progress Notes (Signed)
BH MD/PA/NP OP Progress Note  08/22/2023 11:21 AM FAVION ROSENCRANCE  MRN:  [id number]  Visit Diagnosis:    ICD-10-CM   1. Current moderate episode of major depressive disorder, unspecified whether recurrent (HCC)  F32.1 venlafaxine XR (EFFEXOR-XR) 37.5 MG 24 hr capsule    buPROPion (WELLBUTRIN XL) 150 MG 24 hr tablet    2. Attention deficit hyperactivity disorder (ADHD), combined type  F90.2 amphetamine-dextroamphetamine (ADDERALL) 30 MG tablet    amphetamine-dextroamphetamine (ADDERALL) 30 MG tablet    3. Hashimoto's disease  E06.3        Assessment:  RODMAN GRONLUND is a 67 y.o. male with a history of depression, hashimoto's disease, and reported ADD who presented to [facility] for initial evaluation on 08/13/2022.  During initial evaluation patient reported a history of depression and ADHD though the depression has been well controlled.  He reported experiencing symptoms of fatigue, low mood, amotivation, and anhedonia during periods of depression.  He had 1 instance of passive SI with no plan or intent over 20 years ago.  He denied any SI/HI or thoughts of self-harm at this time along with any history of mania, psychosis, paranoia, or delusions.  Patient also endorsed symptoms of poor concentration, increased sluggishness, fatigue, and impaired memory.  Patient felt these symptoms improved when he had taken Adderall in the past.  Of note patient has a past history of Hashimoto's disease which he is on Synthroid and TSH is currently within normal limits.  Patient met criteria for MDD currently stable and further clarity via neuropsych testing is needed to rule out ADHD.     [name] presents for follow-up evaluation. Today, 08/22/23, patient reports that mood and ADHD symptoms have remained stable with the reinitiation of bupropion.  He denies any adverse medication side effects.  We will continue on current regimen and follow-up in 2  months.  Plan: - Continue Venlafaxine 37.5 mg QD - Continue Bupropion XL 150 mg daily   - Continue Adderall 30 mg BID - CMP, CBC, lipid profile, vitamin D, TSH, and free T4 reviewed - Neuropsych testing completed and reviewed, patient to send in a copy - Sleep specialist referral for sleep apnea - Crisis resources reviewed - Therapy referral - Insurance letter completed - Follow up in 2 months  Chief Complaint:  Chief Complaint  Patient presents with   Follow-up   HPI: Hyman presents reporting that things have been pretty stable on his medication regimen with no adverse side effects.  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  He is also working on building a shed/porch structure just taking up a lot of his time.  Denies any concerns at this time and would like to continue on his current regimen.  Past Psychiatric History: Patient's psychiatric care started in 1995 first with therapy than medication.  He reports trying a number of medications but only remembers Prozac, bupropion, and Effexor in the antidepressant realm.  He has also only tried Adderall as far stimulants ago.  He denies any prior suicide attempts or psychiatric hospitalizations.  Bupropion XL 150 mg started on 08/13/22 and increased to 300 mg on 10/21/2022  Patient reports having half a glass of wine with dinner and using pot occasionally around once or twice a week.  He notes that he does smoke nicotine but wants to quit.    Past Medical History:  Past Medical History:  Diagnosis Date  Hashimoto's disease     Past Surgical History:  Procedure Laterality Date   HAND TENDON SURGERY Left     Family Psychiatric History: His brother has depression and committed suicide  Family History: History reviewed. No pertinent family history.  Social History:  Social History   Socioeconomic History   Marital status: Divorced    Spouse name: Not on file   Number of  children: Not on file   Years of education: Not on file   Highest education level: Not on file  Occupational History   Not on file  Tobacco Use   Smoking status: Never    Passive exposure: Never   Smokeless tobacco: Never  Vaping Use   Vaping status: Every Day  Substance and Sexual Activity   Alcohol use: Yes    Comment: occasionally   Drug use: Yes    Types: Marijuana   Sexual activity: Yes  Other Topics Concern   Not on file  Social History Narrative   Not on file   Social Determinants of Health   Financial Resource Strain: Low Risk  (07/09/2023)   Overall Financial Resource Strain (CARDIA)    Difficulty of Paying Living Expenses: Not hard at all  Food Insecurity: No Food Insecurity (07/09/2023)   Hunger Vital Sign    Worried About Running Out of Food in the Last Year: Never true    Ran Out of Food in the Last Year: Never true  Transportation Needs: No Transportation Needs (07/09/2023)   PRAPARE - Administrator, Civil Service (Medical): No    Lack of Transportation (Non-Medical): No  Physical Activity: Sufficiently Active (07/09/2023)   Exercise Vital Sign    Days of Exercise per Week: 7 days    Minutes of Exercise per Session: 30 min  Stress: No Stress Concern Present (07/09/2023)   [employer] of Occupational Health - Occupational Stress Questionnaire    Feeling of Stress : Not at all  Social Connections: Socially Isolated (07/09/2023)   Social Connection and Isolation Panel [NHANES]    Frequency of Communication with Friends and Family: Once a week    Frequency of Social Gatherings with Friends and Family: Once a week    Attends Religious Services: Never    Database administrator or Organizations: No    Attends Engineer, structural: Never    Marital Status: Divorced    Allergies: No Known Allergies  Current Medications: Current Outpatient Medications  Medication Sig Dispense Refill   [START ON 10/06/2023] amphetamine-dextroamphetamine  (ADDERALL) 30 MG tablet Take 1 tablet by mouth 2 (two) times daily. 60 tablet 0   [START ON 09/04/2023] amphetamine-dextroamphetamine (ADDERALL) 30 MG tablet Take 1 tablet by mouth 2 (two) times daily. 60 tablet 0   buPROPion (WELLBUTRIN XL) 150 MG 24 hr tablet Take 1 tablet (150 mg total) by mouth every morning. 90 tablet 0   ezetimibe (ZETIA) 10 MG tablet Take 1 tablet (10 mg total) by mouth daily. 90 tablet 3   levothyroxine (SYNTHROID) 150 MCG tablet Take 1 tablet (150 mcg total) by mouth daily. 60 tablet 1   sildenafil (VIAGRA) 100 MG tablet Take 0.5-1 tablets (50-100 mg total) by mouth daily as needed for erectile dysfunction. 10 tablet 2   triamcinolone ointment (KENALOG) 0.5 % Apply 1 Application topically 2 (two) times daily. 30 g 0   venlafaxine XR (EFFEXOR-XR) 37.5 MG 24 hr capsule Take 1 capsule (37.5 mg total) by mouth daily. 90 capsule 0   No current  facility-administered medications for this visit.     Psychiatric Specialty Exam: Review of Systems  There were no vitals taken for this visit.There is no height or weight on file to calculate BMI.  General Appearance: Fairly Groomed  Eye Contact:  Good  Speech:  Clear and Coherent and Normal Rate  Volume:  Normal  Mood:  Euthymic  Affect:  Congruent  Thought Process:  Coherent  Orientation:  Full (Time, Place, and Person)  Thought Content: Logical   Suicidal Thoughts:  No  Homicidal Thoughts:  No  Memory:  NA  Judgement:  Good  Insight:  Good  Psychomotor Activity:  Normal  Concentration:  Concentration: Good  Recall:  Good  Fund of Knowledge: Fair  Language: Good  Akathisia:  NA    AIMS (if indicated): not done  Assets:  Communication Skills Desire for Improvement Housing Physical Health  ADL's:  Intact  Cognition: WNL  Sleep:  Good   Metabolic Disorder Labs: Lab Results  Component Value Date   HGBA1C 5.6 06/03/2023   No results found for: "PROLACTIN" Lab Results  Component Value Date   CHOL 161  06/03/2023   TRIG 105 06/03/2023   HDL 37 (L) 06/03/2023   CHOLHDL 4.4 06/03/2023   LDLCALC 105 (H) 06/03/2023   LDLCALC 93 01/14/2023   Lab Results  Component Value Date   TSH 0.431 (L) 06/03/2023   TSH 1.640 04/24/2023    Therapeutic Level Labs: No results found for: "LITHIUM" No results found for: "VALPROATE" No results found for: "CBMZ"   Screenings: GAD-7    Flowsheet Row Office Visit from 05/19/2023 in [facility] Office Visit from 09/04/2022 in [facility] Office Visit from [facility]/13/2022 in [facility]  Total GAD-7 Score 0 0 6      PHQ2-9    Flowsheet Row Clinical Support from 07/09/2023 in [facility] Office Visit from 05/19/2023 in [facility] Office Visit from 09/04/2022 in [facility] Office Visit from 08/13/2022 in [facility] Office Visit from 06/05/2022 in [facility]  PHQ-2 Total Score 0 1 0 0 0  PHQ-9 Total Score -- 6 7 -- --      Flowsheet Row Office Visit from 08/13/2022 in [facility] ED from 03/31/2022 in [facility] Department at [facility] [place]  C-SSRS RISK CATEGORY No Risk No Risk       Collaboration of Care: Collaboration of Care: Medication Management AEB medication prescription and Primary Care Provider AEB chart review  Patient/Guardian was advised Release of Information must be obtained prior to any record release in order to collaborate their care with an outside provider. Patient/Guardian was advised if they have not already done so to contact the registration department to sign all necessary forms in order for [place] to release information regarding their care.   Consent: Patient/Guardian gives verbal consent for treatment and assignment of benefits for services provided during this  visit. Patient/Guardian expressed understanding and agreed to proceed.    Stasia Cavalier, MD 08/22/2023, 11:21 AM   Virtual Visit via Video Note  I connected with Angus Seller on 08/22/23 at 11:00 AM EST by a video enabled telemedicine application and verified that I am speaking with the correct person using two identifiers.  Location: Patient: Home  Provider: Home Office   I discussed the limitations of evaluation and management by telemedicine and  the availability of in person appointments. The patient expressed understanding and agreed to proceed.   I discussed the assessment and treatment plan with the patient. The patient was provided an opportunity to ask questions and all were answered. The patient agreed with the plan and demonstrated an understanding of the instructions.   The patient was advised to call back or seek an in-person evaluation if the symptoms worsen or if the condition fails to improve as anticipated.  I provided 25 minutes of non-face-to-face time during this encounter.   Stasia Cavalier, MD

## 2023-08-22 ENCOUNTER — Encounter (HOSPITAL_COMMUNITY): Payer: Self-pay | Admitting: Psychiatry

## 2023-08-22 ENCOUNTER — Telehealth (HOSPITAL_BASED_OUTPATIENT_CLINIC_OR_DEPARTMENT_OTHER): Payer: HMO | Admitting: Psychiatry

## 2023-08-22 DIAGNOSIS — F902 Attention-deficit hyperactivity disorder, combined type: Secondary | ICD-10-CM | POA: Diagnosis not present

## 2023-08-22 DIAGNOSIS — F321 Major depressive disorder, single episode, moderate: Secondary | ICD-10-CM | POA: Diagnosis not present

## 2023-08-22 DIAGNOSIS — E063 Autoimmune thyroiditis: Secondary | ICD-10-CM

## 2023-08-22 MED ORDER — AMPHETAMINE-DEXTROAMPHETAMINE 30 MG PO TABS: 30.0000 mg | ORAL_TABLET | Freq: Two times a day (BID) | ORAL | 0 refills | Status: DC

## 2023-08-22 MED ORDER — VENLAFAXINE HCL ER 37.5 MG PO CP24
37.5000 mg | ORAL_CAPSULE | Freq: Every day | ORAL | 0 refills | Status: DC
Start: 2023-08-22 — End: 2023-10-24

## 2023-08-22 MED ORDER — BUPROPION HCL ER (XL) 150 MG PO TB24
150.0000 mg | ORAL_TABLET | ORAL | 0 refills | Status: DC
Start: 2023-08-22 — End: 2023-10-24

## 2023-08-22 MED ORDER — AMPHETAMINE-DEXTROAMPHETAMINE 30 MG PO TABS
30.0000 mg | ORAL_TABLET | Freq: Two times a day (BID) | ORAL | 0 refills | Status: DC
Start: 2023-10-06 — End: 2023-10-24

## 2023-09-18 ENCOUNTER — Encounter: Payer: Self-pay | Admitting: Family Medicine

## 2023-09-18 ENCOUNTER — Ambulatory Visit (INDEPENDENT_AMBULATORY_CARE_PROVIDER_SITE_OTHER): Payer: HMO | Admitting: Family Medicine

## 2023-09-18 VITALS — BP 115/72 | HR 83 | Ht 68.0 in | Wt 165.1 lb

## 2023-09-18 DIAGNOSIS — E559 Vitamin D deficiency, unspecified: Secondary | ICD-10-CM | POA: Diagnosis not present

## 2023-09-18 DIAGNOSIS — E063 Autoimmune thyroiditis: Secondary | ICD-10-CM

## 2023-09-18 DIAGNOSIS — R7301 Impaired fasting glucose: Secondary | ICD-10-CM | POA: Diagnosis not present

## 2023-09-18 DIAGNOSIS — F3289 Other specified depressive episodes: Secondary | ICD-10-CM | POA: Diagnosis not present

## 2023-09-18 DIAGNOSIS — E785 Hyperlipidemia, unspecified: Secondary | ICD-10-CM

## 2023-09-18 MED ORDER — EZETIMIBE 10 MG PO TABS: 10.0000 mg | ORAL_TABLET | Freq: Every day | ORAL | 3 refills | Status: DC

## 2023-09-18 NOTE — Assessment & Plan Note (Signed)
He takes ezetimibe 10 mg daily Encouraged to Increase his intake of foods rich in fruits, vegetables, whole grains Lean proteins: chicken, fish, beans, legumes Low Fat dairy products Reduced intake of saturated fats, trans fatty acids, cholesterol Aim to be active at least 5 days a week for 30 minutes each day ( walking briskly)  Pending lipid panel Lab Results  Component Value Date   CHOL 161 06/03/2023   HDL 37 (L) 06/03/2023   LDLCALC 105 (H) 06/03/2023   TRIG 105 06/03/2023   CHOLHDL 4.4 06/03/2023

## 2023-09-18 NOTE — Assessment & Plan Note (Signed)
The patient is stable on venlafaxine 37.5 mg daily and Wellbutrin XL 150 mg daily. He denies any suicidal thoughts or ideation and was encouraged to follow up with his psychiatrist as scheduled. Flowsheet Row Office Visit from 09/18/2023 in [facility]  PHQ-9 Total Score 0

## 2023-09-18 NOTE — Assessment & Plan Note (Addendum)
The patient takes Synthroid 150 mcg daily and reports no complaints today.  Thyroid levels are pending. Lab Results  Component Value Date   TSH 0.431 (L) 06/03/2023

## 2023-09-18 NOTE — Patient Instructions (Signed)
I appreciate the opportunity to provide care to you today!    Follow up:  4 months  Labs: please stop by the lab today to get your blood drawn (CBC, CMP, TSH, Lipid profile, HgA1c, Vit D)   Here are some foods to avoid or reduce in your diet to help manage cholesterol levels:  Fried Foods:Deep-fried items such as french fries, fried chicken, and fried snacks are high in unhealthy fats and can raise LDL (bad) cholesterol levels. Processed Meats:Foods like bacon, sausage, hot dogs, and deli meats are often high in saturated fat and cholesterol. Full-Fat Dairy Products:Whole milk, full-fat yogurt, butter, cream, and cheese are rich in saturated fats, which can increase cholesterol levels. Baked Goods and Sweets:Pastries, cakes, cookies, and donuts often contain trans fats and added sugars, which can raise LDL cholesterol and lower HDL (good) cholesterol. Red Meat:Beef, lamb, and pork are high in saturated fat. Lean cuts or plant-based protein alternatives are better options. Lard and Shortening:Used in some baked goods, lard and shortening are high in trans fats and should be avoided. Fast Food:Many fast food items are cooked with unhealthy oils and contain high amounts of saturated and trans fats. Processed Snacks:Chips, crackers, and certain microwave popcorns can contain trans fats and high levels of unhealthy oils. Shellfish:While nutritious in other ways, some shellfish like shrimp, lobster, and crab are high in cholesterol. They should be consumed in moderation. Coconut and Palm Oils:these oils are high in saturated fat and can raise cholesterol levels when used in cooking or baking.       Please continue to a heart-healthy diet and increase your physical activities. Try to exercise for at least five days a week.    It was a pleasure to see you and I look forward to continuing to work together on your health and well-being. Please do not hesitate to call the office if you need  care or have questions about your care.  In case of emergency, please visit the Emergency Department for urgent care, or contact our clinic at [phone] to schedule an appointment. We're here to help you!   Have a wonderful day and week. With Gratitude, Gilmore Laroche MSN, FNP-BC

## 2023-09-18 NOTE — Progress Notes (Signed)
Established Patient Office Visit  Subjective:  Patient ID: Troy Allen, male    DOB: [birth year]  Age: 68 y.o. MRN: [id number]  CC:  Chief Complaint  Patient presents with   Care Management    4 month f/u, needs refills    HPI Troy Allen is a 67 y.o. male with past medical history of Hashimoto's, depression, and hyperlipidemia presents for f/u of  chronic medical conditions. For the details of today's visit, please refer to the assessment and plan.     Past Medical History:  Diagnosis Date   Hashimoto's disease     Past Surgical History:  Procedure Laterality Date   HAND TENDON SURGERY Left     History reviewed. No pertinent family history.  Social History   Socioeconomic History   Marital status: Divorced    Spouse name: Not on file   Number of children: Not on file   Years of education: Not on file   Highest education level: Not on file  Occupational History   Not on file  Tobacco Use   Smoking status: Never    Passive exposure: Never   Smokeless tobacco: Never  Vaping Use   Vaping status: Every Day  Substance and Sexual Activity   Alcohol use: Yes    Comment: occasionally   Drug use: Yes    Types: Marijuana   Sexual activity: Yes  Other Topics Concern   Not on file  Social History Narrative   Not on file   Social Determinants of Health   Financial Resource Strain: Low Risk  (07/09/2023)   Overall Financial Resource Strain (CARDIA)    Difficulty of Paying Living Expenses: Not hard at all  Food Insecurity: No Food Insecurity (07/09/2023)   Hunger Vital Sign    Worried About Running Out of Food in the Last Year: Never true    Ran Out of Food in the Last Year: Never true  Transportation Needs: No Transportation Needs (07/09/2023)   PRAPARE - Administrator, Civil Service (Medical): No    Lack of Transportation (Non-Medical): No  Physical Activity: Sufficiently Active (07/09/2023)   Exercise Vital Sign    Days of Exercise per  Week: 7 days    Minutes of Exercise per Session: 30 min  Stress: No Stress Concern Present (07/09/2023)   [employer] of Occupational Health - Occupational Stress Questionnaire    Feeling of Stress : Not at all  Social Connections: Socially Isolated (07/09/2023)   Social Connection and Isolation Panel [NHANES]    Frequency of Communication with Friends and Family: Once a week    Frequency of Social Gatherings with Friends and Family: Once a week    Attends Religious Services: Never    Database administrator or Organizations: No    Attends Banker Meetings: Never    Marital Status: Divorced  Catering manager Violence: Not At Risk (07/09/2023)   Humiliation, Afraid, Rape, and Kick questionnaire    Fear of Current or Ex-Partner: No    Emotionally Abused: No    Physically Abused: No    Sexually Abused: No    Outpatient Medications Prior to Visit  Medication Sig Dispense Refill   [START ON 10/06/2023] amphetamine-dextroamphetamine (ADDERALL) 30 MG tablet Take 1 tablet by mouth 2 (two) times daily. 60 tablet 0   amphetamine-dextroamphetamine (ADDERALL) 30 MG tablet Take 1 tablet by mouth 2 (two) times daily. 60 tablet 0   buPROPion (WELLBUTRIN XL) 150 MG 24 hr tablet Take  1 tablet (150 mg total) by mouth every morning. 90 tablet 0   levothyroxine (SYNTHROID) 150 MCG tablet Take 1 tablet (150 mcg total) by mouth daily. 60 tablet 1   sildenafil (VIAGRA) 100 MG tablet Take 0.5-1 tablets (50-100 mg total) by mouth daily as needed for erectile dysfunction. 10 tablet 2   triamcinolone ointment (KENALOG) 0.5 % Apply 1 Application topically 2 (two) times daily. 30 g 0   venlafaxine XR (EFFEXOR-XR) 37.5 MG 24 hr capsule Take 1 capsule (37.5 mg total) by mouth daily. 90 capsule 0   ezetimibe (ZETIA) 10 MG tablet Take 1 tablet (10 mg total) by mouth daily. 90 tablet 3   No facility-administered medications prior to visit.    No Known Allergies  ROS Review of Systems   Constitutional:  Negative for fatigue and fever.  Eyes:  Negative for visual disturbance.  Respiratory:  Negative for chest tightness and shortness of breath.   Cardiovascular:  Negative for chest pain and palpitations.  Neurological:  Negative for dizziness and headaches.      Objective:    Physical Exam HENT:     Head: Normocephalic.     Right Ear: External ear normal.     Left Ear: External ear normal.     Nose: No congestion or rhinorrhea.     Mouth/Throat:     Mouth: Mucous membranes are moist.  Cardiovascular:     Rate and Rhythm: Regular rhythm.     Heart sounds: No murmur heard. Pulmonary:     Effort: No respiratory distress.     Breath sounds: Normal breath sounds.  Neurological:     Mental Status: He is alert.     BP 115/72   Pulse 83   Ht 5\' 8"  (1.727 m)   Wt 165 lb 1.3 oz (74.9 kg)   SpO2 97%   BMI 25.10 kg/m  Wt Readings from Last 3 Encounters:  09/18/23 165 lb 1.3 oz (74.9 kg)  07/09/23 167 lb (75.8 kg)  05/19/23 166 lb (75.3 kg)    Lab Results  Component Value Date   TSH 0.431 (L) 06/03/2023   Lab Results  Component Value Date   WBC 6.5 06/03/2023   HGB 13.6 06/03/2023   HCT 40.0 06/03/2023   MCV 98 (H) 06/03/2023   PLT 311 06/03/2023   Lab Results  Component Value Date   NA 140 06/03/2023   K 4.6 06/03/2023   CO2 23 06/03/2023   GLUCOSE 92 06/03/2023   BUN 11 06/03/2023   CREATININE 0.93 06/03/2023   BILITOT 0.3 06/03/2023   ALKPHOS 59 06/03/2023   AST 19 06/03/2023   ALT 15 06/03/2023   PROT 6.5 06/03/2023   ALBUMIN 4.3 06/03/2023   CALCIUM 9.2 06/03/2023   EGFR 89 06/03/2023   Lab Results  Component Value Date   CHOL 161 06/03/2023   Lab Results  Component Value Date   HDL 37 (L) 06/03/2023   Lab Results  Component Value Date   LDLCALC 105 (H) 06/03/2023   Lab Results  Component Value Date   TRIG 105 06/03/2023   Lab Results  Component Value Date   CHOLHDL 4.4 06/03/2023   Lab Results  Component Value Date    HGBA1C 5.6 06/03/2023      Assessment & Plan:  Hashimoto's disease Assessment & Plan: The patient takes Synthroid 150 mcg daily and reports no complaints today.  Thyroid levels are pending. Lab Results  Component Value Date   TSH 0.431 (L) 06/03/2023  Orders: -     TSH + free T4  Hyperlipidemia LDL goal <100 Assessment & Plan: He takes ezetimibe 10 mg daily Encouraged to Increase his intake of foods rich in fruits, vegetables, whole grains Lean proteins: chicken, fish, beans, legumes Low Fat dairy products Reduced intake of saturated fats, trans fatty acids, cholesterol Aim to be active at least 5 days a week for 30 minutes each day ( walking briskly)  Pending lipid panel Lab Results  Component Value Date   CHOL 161 06/03/2023   HDL 37 (L) 06/03/2023   LDLCALC 105 (H) 06/03/2023   TRIG 105 06/03/2023   CHOLHDL 4.4 06/03/2023     Orders: -     Lipid panel -     CMP14+EGFR -     CBC with Differential/Platelet -     Ezetimibe; Take 1 tablet (10 mg total) by mouth daily.  Dispense: 90 tablet; Refill: 3  Other depression Assessment & Plan: The patient is stable on venlafaxine 37.5 mg daily and Wellbutrin XL 150 mg daily. He denies any suicidal thoughts or ideation and was encouraged to follow up with his psychiatrist as scheduled. Flowsheet Row Office Visit from 09/18/2023 in [facility]  PHQ-9 Total Score 0           IFG (impaired fasting glucose) -     Hemoglobin A1c  Vitamin D deficiency -     VITAMIN D 25 Hydroxy (Vit-D Deficiency, Fractures)   Note: This chart has been completed using Engineer, civil (consulting) software, and while attempts have been made to ensure accuracy, certain words and phrases may not be transcribed as intended.   Follow-up: Return in about 4 months (around 01/17/2024).   Gilmore Laroche, FNP

## 2023-09-22 DIAGNOSIS — E559 Vitamin D deficiency, unspecified: Secondary | ICD-10-CM | POA: Diagnosis not present

## 2023-09-22 DIAGNOSIS — R7301 Impaired fasting glucose: Secondary | ICD-10-CM | POA: Diagnosis not present

## 2023-09-22 DIAGNOSIS — E063 Autoimmune thyroiditis: Secondary | ICD-10-CM | POA: Diagnosis not present

## 2023-09-22 DIAGNOSIS — E7849 Other hyperlipidemia: Secondary | ICD-10-CM | POA: Diagnosis not present

## 2023-09-23 LAB — CMP14+EGFR
ALT: 18 [IU]/L (ref 0–44)
AST: 22 [IU]/L (ref 0–40)
Albumin: 4 g/dL (ref 3.9–4.9)
Alkaline Phosphatase: 64 [IU]/L (ref 44–121)
BUN/Creatinine Ratio: 14 (ref 10–24)
BUN: 13 mg/dL (ref 8–27)
Bilirubin Total: 0.2 mg/dL (ref 0.0–1.2)
CO2: 22 mmol/L (ref 20–29)
Calcium: 9 mg/dL (ref 8.6–10.2)
Chloride: 103 mmol/L (ref 96–106)
Creatinine, Ser: 0.93 mg/dL (ref 0.76–1.27)
Globulin, Total: 2.4 g/dL (ref 1.5–4.5)
Glucose: 88 mg/dL (ref 70–99)
Potassium: 4.6 mmol/L (ref 3.5–5.2)
Sodium: 139 mmol/L (ref 134–144)
Total Protein: 6.4 g/dL (ref 6.0–8.5)
eGFR: 89 mL/min/{1.73_m2} (ref >59)

## 2023-09-23 LAB — CBC WITH DIFFERENTIAL/PLATELET
Basophils Absolute: 0.1 10*3/uL (ref 0.0–0.2)
Basos: 1 %
EOS (ABSOLUTE): 0.2 10*3/uL (ref 0.0–0.4)
Eos: 3 %
Hematocrit: 38.3 % (ref 37.5–51.0)
Hemoglobin: 12.7 g/dL — ABNORMAL LOW (ref 13.0–17.7)
Immature Grans (Abs): 0 10*3/uL (ref 0.0–0.1)
Immature Granulocytes: 0 %
Lymphocytes Absolute: 1.8 10*3/uL (ref 0.7–3.1)
Lymphs: 31 %
MCH: 32.9 pg (ref 26.6–33.0)
MCHC: 33.2 g/dL (ref 31.5–35.7)
MCV: 99 fL — ABNORMAL HIGH (ref 79–97)
Monocytes Absolute: 0.6 10*3/uL (ref 0.1–0.9)
Monocytes: 10 %
Neutrophils Absolute: 3.3 10*3/uL (ref 1.4–7.0)
Neutrophils: 55 %
Platelets: 288 10*3/uL (ref 150–450)
RBC: 3.86 x10E6/uL — ABNORMAL LOW (ref 4.14–5.80)
RDW: 12.4 % (ref 11.6–15.4)
WBC: 6 10*3/uL (ref 3.4–10.8)

## 2023-09-23 LAB — LIPID PANEL
Chol/HDL Ratio: 3.7 {ratio} (ref 0.0–5.0)
Cholesterol, Total: 141 mg/dL (ref 100–199)
HDL: 38 mg/dL — ABNORMAL LOW (ref >39)
LDL Chol Calc (NIH): 88 mg/dL (ref 0–99)
Triglycerides: 74 mg/dL (ref 0–149)
VLDL Cholesterol Cal: 15 mg/dL (ref 5–40)

## 2023-09-23 LAB — HEMOGLOBIN A1C
Est. average glucose Bld gHb Est-mCnc: 120 mg/dL
Hgb A1c MFr Bld: 5.8 % — ABNORMAL HIGH (ref 4.8–5.6)

## 2023-09-23 LAB — VITAMIN D 25 HYDROXY (VIT D DEFICIENCY, FRACTURES): Vit D, 25-Hydroxy: 37 ng/mL (ref 30.0–100.0)

## 2023-09-23 LAB — TSH+FREE T4
Free T4: 1.55 ng/dL (ref 0.82–1.77)
TSH: 1.01 u[IU]/mL (ref 0.450–4.500)

## 2023-09-30 ENCOUNTER — Encounter: Payer: Self-pay | Admitting: Family Medicine

## 2023-10-13 ENCOUNTER — Other Ambulatory Visit: Payer: Self-pay | Admitting: Family Medicine

## 2023-10-13 DIAGNOSIS — E063 Autoimmune thyroiditis: Secondary | ICD-10-CM

## 2023-10-13 MED ORDER — LEVOTHYROXINE SODIUM 150 MCG PO TABS
150.0000 ug | ORAL_TABLET | Freq: Every day | ORAL | 1 refills | Status: DC
Start: 2023-10-13 — End: 2023-11-25

## 2023-10-22 NOTE — Progress Notes (Signed)
 BH MD/PA/NP OP Progress Note  10/24/2023 9:10 AM Troy Allen Allen  MRN:  [id number]  Visit Diagnosis:    ICD-10-CM   1. Attention deficit hyperactivity disorder (ADHD), combined type  F90.2 amphetamine -dextroamphetamine  (ADDERALL) 30 MG tablet    amphetamine -dextroamphetamine  (ADDERALL) 30 MG tablet    2. Current moderate episode of major depressive disorder, unspecified whether recurrent (HCC)  F32.1 buPROPion  (WELLBUTRIN  XL) 150 MG 24 hr tablet    venlafaxine  XR (EFFEXOR -XR) 37.5 MG 24 hr capsule      Assessment:  Troy Allen Allen is a 68 y.o. male with a history of depression, hashimoto's disease, and reported ADD who presented to [facility] at [facility] for initial evaluation on 08/13/2022.  During initial evaluation patient reported a history of depression and ADHD though the depression has been well controlled.  He reported experiencing symptoms of fatigue, low mood, amotivation, and anhedonia during periods of depression.  He had 1 instance of passive SI with no plan or intent over 20 years ago.  He denied any SI/HI or thoughts of self-harm at this time along with any history of mania, psychosis, paranoia, or delusions.  Patient also endorsed symptoms of poor concentration, increased sluggishness, fatigue, and impaired memory.  Patient felt these symptoms improved when he had taken Adderall in the past.  Of note patient has a past history of Hashimoto's disease which he is on Synthroid  and TSH is currently within normal limits.  Patient met criteria for MDD currently stable and further clarity via neuropsych testing is needed to rule out ADHD.     Troy Allen Allen presents for follow-up evaluation. Today, 10/25/23, patient reports that mood and ADHD symptoms have been stable. He denies any adverse medication side effects.  We will continue on current regimen and follow-up in 2 months.  Plan: - Continue Venlafaxine  37.5 mg QD - Continue Bupropion  XL 150 mg daily    - Continue Adderall 30 mg BID - CMP, CBC, lipid profile, vitamin D , TSH, and free T4 reviewed - Neuropsych testing completed and reviewed, patient to send in a copy - Sleep specialist referral for sleep apnea - Crisis resources reviewed - Therapy referral - Insurance letter completed - Follow up in 2 months  Chief Complaint:  Chief Complaint  Patient presents with   Follow-up   HPI: Troy Allen Allen presents reporting that he is doing well. He did not celebrate christmas this year which he enjoyed. He has been thinking back to how the holiday is mainly for kids, and how busy he was in the past working to provide everything for his family. Troy Allen Allen has been thinking about how thankful he is for his current life.  Discussed his sons Troy Allen Allen, and Troy Allen. Allen has the best relationship with his youngest, average with is middle son, and poor relationship with his oldest. His oldest is still living with Troy Allen Allen ex wife. Troy Allen Allen has anxiety about what will happen/who will care for Troy Allen after his mother no longer can. [name] does not have the means to care for Troy Allen at this time.   Things have remained stable on his medication regimen with no adverse side effects. Denies any concerns at this time and would like to continue on his current regimen.  Past Psychiatric History: Patient's psychiatric care started in 1995 first with therapy than medication.  He reports trying a number of medications but only remembers Prozac, bupropion , and Effexor  in the antidepressant realm.  He has also only tried Adderall as far stimulants ago.  He  denies any prior suicide attempts or psychiatric hospitalizations.  Bupropion  XL 150 mg started on 08/13/22 and increased to 300 mg on 10/21/2022  Patient reports having half a glass of wine with dinner and using pot occasionally around once or twice a week.  He notes that he does smoke nicotine but wants to quit.    Past Medical History:  Past Medical History:  Diagnosis  Date   Hashimoto's disease     Past Surgical History:  Procedure Laterality Date   HAND TENDON SURGERY Left     Family Psychiatric History: His brother has depression and committed suicide  Family History: No family history on file.  Social History:  Social History   Socioeconomic History   Marital status: Divorced    Spouse name: Not on file   Number of children: Not on file   Years of education: Not on file   Highest education level: Not on file  Occupational History   Not on file  Tobacco Use   Smoking status: Never    Passive exposure: Never   Smokeless tobacco: Never  Vaping Use   Vaping status: Every Day  Substance and Sexual Activity   Alcohol use: Yes    Comment: occasionally   Drug use: Yes    Types: Marijuana   Sexual activity: Yes  Other Topics Concern   Not on file  Social History Narrative   Not on file   Social Drivers of Health   Financial Resource Strain: Low Risk  (07/09/2023)   Overall Financial Resource Strain (CARDIA)    Difficulty of Paying Living Expenses: Not hard at all  Food Insecurity: No Food Insecurity (07/09/2023)   Hunger Vital Sign    Worried About Running Out of Food in the Last Year: Never true    Ran Out of Food in the Last Year: Never true  Transportation Needs: No Transportation Needs (07/09/2023)   PRAPARE - Administrator, Civil Service (Medical): No    Lack of Transportation (Non-Medical): No  Physical Activity: Sufficiently Active (07/09/2023)   Exercise Vital Sign    Days of Exercise per Week: 7 days    Minutes of Exercise per Session: 30 min  Stress: No Stress Concern Present (07/09/2023)   [employer] of Occupational Health - Occupational Stress Questionnaire    Feeling of Stress : Not at all  Social Connections: Socially Isolated (07/09/2023)   Social Connection and Isolation Panel [NHANES]    Frequency of Communication with Friends and Family: Once a week    Frequency of Social Gatherings with  Friends and Family: Once a week    Attends Religious Services: Never    Database Administrator or Organizations: No    Attends Engineer, Structural: Never    Marital Status: Divorced    Allergies: No Known Allergies  Current Medications: Current Outpatient Medications  Medication Sig Dispense Refill   [START ON 11/03/2023] amphetamine -dextroamphetamine  (ADDERALL) 30 MG tablet Take 1 tablet by mouth 2 (two) times daily. 60 tablet 0   [START ON 12/04/2023] amphetamine -dextroamphetamine  (ADDERALL) 30 MG tablet Take 1 tablet by mouth 2 (two) times daily. 60 tablet 0   buPROPion  (WELLBUTRIN  XL) 150 MG 24 hr tablet Take 1 tablet (150 mg total) by mouth every morning. 90 tablet 0   ezetimibe  (ZETIA ) 10 MG tablet Take 1 tablet (10 mg total) by mouth daily. 90 tablet 3   levothyroxine  (SYNTHROID ) 150 MCG tablet Take 1 tablet (150 mcg total) by mouth daily. 60  tablet 1   sildenafil  (VIAGRA ) 100 MG tablet Take 0.5-1 tablets (50-100 mg total) by mouth daily as needed for erectile dysfunction. 10 tablet 2   triamcinolone  ointment (KENALOG ) 0.5 % Apply 1 Application topically 2 (two) times daily. 30 g 0   venlafaxine  XR (EFFEXOR -XR) 37.5 MG 24 hr capsule Take 1 capsule (37.5 mg total) by mouth daily. 90 capsule 0   No current facility-administered medications for this visit.     Psychiatric Specialty Exam: Review of Systems  There were no vitals taken for this visit.There is no height or weight on file to calculate BMI.  General Appearance: Fairly Groomed  Eye Contact:  Good  Speech:  Clear and Coherent and Normal Rate  Volume:  Normal  Mood:  Euthymic  Affect:  Congruent  Thought Process:  Coherent  Orientation:  Full (Time, Place, and Person)  Thought Content: Logical   Suicidal Thoughts:  No  Homicidal Thoughts:  No  Memory:  NA  Judgement:  Good  Insight:  Good  Psychomotor Activity:  Normal  Concentration:  Concentration: Good  Recall:  Good  Fund of Knowledge: Fair   Language: Good  Akathisia:  NA    AIMS (if indicated): not done  Assets:  Communication Skills Desire for Improvement Housing Physical Health  ADL's:  Intact  Cognition: WNL  Sleep:  Good   Metabolic Disorder Labs: Lab Results  Component Value Date   HGBA1C 5.8 (H) 09/22/2023   No results found for: PROLACTIN Lab Results  Component Value Date   CHOL 141 09/22/2023   TRIG 74 09/22/2023   HDL 38 (L) 09/22/2023   CHOLHDL 3.7 09/22/2023   LDLCALC 88 09/22/2023   LDLCALC 105 (H) 06/03/2023   Lab Results  Component Value Date   TSH 1.010 09/22/2023   TSH 0.431 (L) 06/03/2023    Therapeutic Level Labs: No results found for: LITHIUM No results found for: VALPROATE No results found for: CBMZ   Screenings: GAD-7    Flowsheet Row Office Visit from 09/18/2023 in [facility] Office Visit from 05/19/2023 in [facility] Office Visit from 09/04/2022 in [facility] Office Visit from [facility]/13/2022 [facility]  Total GAD-7 Score 0 0 0 6      PHQ2-9    Flowsheet Row Office Visit from 09/18/2023 in [facility] Clinical Support from 07/09/2023 in [facility] Office Visit from 05/19/2023 in [facility] Office Visit from 09/04/2022 in [facility] Office Visit from 08/13/2022 in [facility]  PHQ-2 Total Score 0 0 1 0 0  PHQ-9 Total Score 0 -- 6 7 --      Flowsheet Row Office Visit from 08/13/2022 in [facility] ED from 03/31/2022 in [facility] at [facility]  C-SSRS RISK CATEGORY No Risk No Risk       Collaboration of Care: Collaboration of Care: Medication Management AEB medication prescription and Primary Care Provider AEB chart review  Patient/Guardian was  advised Release of Information must be obtained prior to any record release in order to collaborate their care with an outside provider. Patient/Guardian was advised if they have not already done so to contact the registration department to sign all necessary forms in order for us  to release information regarding their care.   Consent: Patient/Guardian gives verbal consent for treatment and assignment of benefits  for services provided during this visit. Patient/Guardian expressed understanding and agreed to proceed.    Arvella CHRISTELLA Finder, MD 10/25/2023, 9:10 AM   Virtual Visit via Video Note  I connected with Troy Allen Allen on 10/24/23 at 11:00 AM EST by a video enabled telemedicine application and verified that I am speaking with the correct person using two identifiers.  Location: Patient: Home  Provider: Home Office   I discussed the limitations of evaluation and management by telemedicine and the availability of in person appointments. The patient expressed understanding and agreed to proceed.   I discussed the assessment and treatment plan with the patient. The patient was provided an opportunity to ask questions and all were answered. The patient agreed with the plan and demonstrated an understanding of the instructions.   The patient was advised to call back or seek an in-person evaluation if the symptoms worsen or if the condition fails to improve as anticipated.  I provided 25 minutes of non-face-to-face time during this encounter.   Arvella CHRISTELLA Finder, MD

## 2023-10-24 ENCOUNTER — Telehealth (HOSPITAL_COMMUNITY): Payer: HMO | Admitting: Psychiatry

## 2023-10-24 DIAGNOSIS — F902 Attention-deficit hyperactivity disorder, combined type: Secondary | ICD-10-CM | POA: Diagnosis not present

## 2023-10-24 DIAGNOSIS — F321 Major depressive disorder, single episode, moderate: Secondary | ICD-10-CM

## 2023-10-24 MED ORDER — AMPHETAMINE-DEXTROAMPHETAMINE 30 MG PO TABS: 30.0000 mg | ORAL_TABLET | Freq: Two times a day (BID) | ORAL | 0 refills | Status: DC

## 2023-10-24 MED ORDER — VENLAFAXINE HCL ER 37.5 MG PO CP24: 37.5000 mg | ORAL_CAPSULE | Freq: Every day | ORAL | 0 refills | Status: DC

## 2023-10-24 MED ORDER — BUPROPION HCL ER (XL) 150 MG PO TB24: 150.0000 mg | ORAL_TABLET | ORAL | 0 refills | Status: DC

## 2023-10-25 ENCOUNTER — Encounter (HOSPITAL_COMMUNITY): Payer: Self-pay | Admitting: Psychiatry

## 2023-11-22 ENCOUNTER — Encounter: Payer: Self-pay | Admitting: Family Medicine

## 2023-11-25 ENCOUNTER — Other Ambulatory Visit: Payer: Self-pay

## 2023-11-25 DIAGNOSIS — E063 Autoimmune thyroiditis: Secondary | ICD-10-CM

## 2023-11-25 MED ORDER — LEVOTHYROXINE SODIUM 150 MCG PO TABS: 150.0000 ug | ORAL_TABLET | Freq: Every day | ORAL | 1 refills | Status: DC

## 2023-11-25 NOTE — Telephone Encounter (Signed)
Refill sent.

## 2023-11-26 ENCOUNTER — Other Ambulatory Visit: Payer: Self-pay | Admitting: Family Medicine

## 2023-11-26 DIAGNOSIS — E063 Autoimmune thyroiditis: Secondary | ICD-10-CM

## 2023-12-22 NOTE — Progress Notes (Unsigned)
 BH MD/PA/NP OP Progress Note  12/26/2023 11:35 AM Troy Allen  MRN:  [id number]  Visit Diagnosis:    ICD-10-CM   1. Attention deficit hyperactivity disorder (ADHD), combined type  F90.2 amphetamine-dextroamphetamine (ADDERALL) 30 MG tablet    amphetamine-dextroamphetamine (ADDERALL) 30 MG tablet    amphetamine-dextroamphetamine (ADDERALL) 30 MG tablet    2. Current moderate episode of major depressive disorder, unspecified whether recurrent (HCC)  F32.1 buPROPion (WELLBUTRIN XL) 150 MG 24 hr tablet    venlafaxine XR (EFFEXOR-XR) 37.5 MG 24 hr capsule    3. Hashimoto's disease  E06.3       Assessment:  Troy Allen is a 68 y.o. male with a history of depression, hashimoto's disease, and reported ADD who presented to [facility] at [facility] for initial evaluation on 08/13/2022.  During initial evaluation patient reported a history of depression and ADHD though the depression has been well controlled.  He reported experiencing symptoms of fatigue, low mood, amotivation, and anhedonia during periods of depression.  He had 1 instance of passive SI with no plan or intent over 20 years ago.  He denied any SI/HI or thoughts of self-harm at this time along with any history of mania, psychosis, paranoia, or delusions.  Patient also endorsed symptoms of poor concentration, increased sluggishness, fatigue, and impaired memory.  Patient felt these symptoms improved when he had taken Adderall in the past.  Of note patient has a past history of Hashimoto's disease which he is on Synthroid and TSH is currently within normal limits.  Patient met criteria for MDD currently stable and neuropsych testing was positive for ADHD.    Troy Allen presents for follow-up evaluation. Today, 12/26/23, patient reports that his mood and ADHD symptoms have been fairly stable.  He did report that he had been smoking marijuana in the interim however since stopped due to the adverse  effects that had on his breathing and mental function.  He does still intermittently use edibles per patient report.  Counseling was provided regarding the adverse effects of marijuana on concentration and focus. We will continue on current regimen and follow-up in 3 months.  Plan: - Continue Venlafaxine 37.5 mg QD - Continue Bupropion XL 150 mg daily   - Continue Adderall 30 mg BID - CMP, CBC, lipid profile, vitamin D, TSH, and free T4 reviewed - Neuropsych testing completed and reviewed, patient to send in a copy - Sleep specialist referral for sleep apnea - Crisis resources reviewed - Therapy referral - Insurance letter completed - Follow up in 3 months  Chief Complaint:  Chief Complaint  Patient presents with   Follow-up   HPI: Troy Allen presents reporting that he is doing pretty good in the interim. He was sick for the past week, but is getting over it now. He also had some frustration/impatience in the interim as he had to take time away from his likes (writing/drawing) to work on home projects. 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  Outside of these 2 issues however his mental health been fairly stable.  He continues to take his medication regularly and finds them to be effective.  His mood has been mostly upbeat.  Notably patient did report that he and his partner had been smoking marijuana daily however it aggravated his breathing and was making him feel mentally fuzzy.  Thus he has since stopped his marijuana smoking and reports that he will only use  gummy edible intermittently.  We reviewed the adverse effects of marijuana particularly on concentration and focus.  With his Adderall use the marijuana would be directly counterproductive to the benefits.  Past Psychiatric History: Patient's psychiatric care started in 1995 first with therapy than medication.  He reports trying a number of medications but only remembers Prozac,  bupropion, and Effexor in the antidepressant realm.  He has also only tried Adderall as far stimulants ago.  He denies any prior suicide attempts or psychiatric hospitalizations.  Bupropion XL 150 mg started on 08/13/22 and increased to 300 mg on 10/21/2022  Patient reports having half a glass of wine with dinner and using pot occasionally around once or twice a week.  He notes that he does smoke nicotine but wants to quit.    Past Medical History:  Past Medical History:  Diagnosis Date   Hashimoto's disease     Past Surgical History:  Procedure Laterality Date   HAND TENDON SURGERY Left     Family Psychiatric History: His brother has depression and committed suicide  Family History: No family history on file.  Social History:  Social History   Socioeconomic History   Marital status: Divorced    Spouse name: Not on file   Number of children: Not on file   Years of education: Not on file   Highest education level: Not on file  Occupational History   Not on file  Tobacco Use   Smoking status: Never    Passive exposure: Never   Smokeless tobacco: Never  Vaping Use   Vaping status: Every Day  Substance and Sexual Activity   Alcohol use: Yes    Comment: occasionally   Drug use: Yes    Types: Marijuana   Sexual activity: Yes  Other Topics Concern   Not on file  Social History Narrative   Not on file   Social Drivers of Health   Financial Resource Strain: Low Risk  (07/09/2023)   Overall Financial Resource Strain (CARDIA)    Difficulty of Paying Living Expenses: Not hard at all  Food Insecurity: No Food Insecurity (07/09/2023)   Hunger Vital Sign    Worried About Running Out of Food in the Last Year: Never true    Ran Out of Food in the Last Year: Never true  Transportation Needs: No Transportation Needs (07/09/2023)   PRAPARE - Administrator, Civil Service (Medical): No    Lack of Transportation (Non-Medical): No  Physical Activity: Sufficiently Active  (07/09/2023)   Exercise Vital Sign    Days of Exercise per Week: 7 days    Minutes of Exercise per Session: 30 min  Stress: No Stress Concern Present (07/09/2023)   [employer] of Occupational Health - Occupational Stress Questionnaire    Feeling of Stress : Not at all  Social Connections: Socially Isolated (07/09/2023)   Social Connection and Isolation Panel [NHANES]    Frequency of Communication with Friends and Family: Once a week    Frequency of Social Gatherings with Friends and Family: Once a week    Attends Religious Services: Never    Database administrator or Organizations: No    Attends Engineer, structural: Never    Marital Status: Divorced    Allergies: No Known Allergies  Current Medications: Current Outpatient Medications  Medication Sig Dispense Refill   [START ON 03/02/2024] amphetamine-dextroamphetamine (ADDERALL) 30 MG tablet Take 1 tablet by mouth 2 (two) times daily. 60 tablet 0   [START ON  01/02/2024] amphetamine-dextroamphetamine (ADDERALL) 30 MG tablet Take 1 tablet by mouth 2 (two) times daily. 60 tablet 0   [START ON 02/02/2024] amphetamine-dextroamphetamine (ADDERALL) 30 MG tablet Take 1 tablet by mouth 2 (two) times daily. 60 tablet 0   buPROPion (WELLBUTRIN XL) 150 MG 24 hr tablet Take 1 tablet (150 mg total) by mouth every morning. 90 tablet 0   ezetimibe (ZETIA) 10 MG tablet Take 1 tablet (10 mg total) by mouth daily. 90 tablet 3   levothyroxine (SYNTHROID) 150 MCG tablet Take 1 tablet (150 mcg total) by mouth daily. 60 tablet 1   sildenafil (VIAGRA) 100 MG tablet Take 0.5-1 tablets (50-100 mg total) by mouth daily as needed for erectile dysfunction. 10 tablet 2   triamcinolone ointment (KENALOG) 0.5 % Apply 1 Application topically 2 (two) times daily. 30 g 0   venlafaxine XR (EFFEXOR-XR) 37.5 MG 24 hr capsule Take 1 capsule (37.5 mg total) by mouth daily. 90 capsule 0   No current facility-administered medications for this visit.      Psychiatric Specialty Exam: Review of Systems  There were no vitals taken for this visit.There is no height or weight on file to calculate BMI.  General Appearance: Fairly Groomed  Eye Contact:  Good  Speech:  Clear and Coherent and Normal Rate  Volume:  Normal  Mood:  Euthymic  Affect:  Congruent  Thought Process:  Coherent  Orientation:  Full (Time, Place, and Person)  Thought Content: Logical   Suicidal Thoughts:  No  Homicidal Thoughts:  No  Memory:  NA  Judgement:  Good  Insight:  Good  Psychomotor Activity:  Normal  Concentration:  Concentration: Good  Recall:  Good  Fund of Knowledge: Fair  Language: Good  Akathisia:  NA    AIMS (if indicated): not done  Assets:  Communication Skills Desire for Improvement Housing Physical Health  ADL's:  Intact  Cognition: WNL  Sleep:  Good   Metabolic Disorder Labs: Lab Results  Component Value Date   HGBA1C 5.8 (H) 09/22/2023   No results found for: "PROLACTIN" Lab Results  Component Value Date   CHOL 141 09/22/2023   TRIG 74 09/22/2023   HDL 38 (L) 09/22/2023   CHOLHDL 3.7 09/22/2023   LDLCALC 88 09/22/2023   LDLCALC 105 (H) 06/03/2023   Lab Results  Component Value Date   TSH 1.010 09/22/2023   TSH 0.431 (L) 06/03/2023    Therapeutic Level Labs: No results found for: "LITHIUM" No results found for: "VALPROATE" No results found for: "CBMZ"   Screenings: GAD-7    Flowsheet Row Office Visit from 09/18/2023 in [facility] Office Visit from 05/19/2023 in [facility] Office Visit from 09/04/2022 in [facility] Office Visit from [facility]/13/2022 [facility]  Total GAD-7 Score 0 0 0 6      PHQ2-9    Flowsheet Row Office Visit from 09/18/2023 in [facility] Clinical Support from 07/09/2023 in [facility] Office Visit from 05/19/2023 in [facility] Office Visit from 09/04/2022 in [facility] Office Visit from 08/13/2022 in [facility]  PHQ-2 Total Score 0 0 1 0 0  PHQ-9 Total Score 0 -- 6 7 --      Flowsheet Row Office Visit from 08/13/2022 in [facility] ED from 03/31/2022 in [facility] Emergency Department at [facility]  C-SSRS RISK CATEGORY No Risk No Risk       Collaboration of Care: Collaboration of Care: Medication Management AEB medication prescription and Primary Care Provider AEB chart review  Patient/Guardian was advised Release of Information must be obtained prior to any record release in order to collaborate their care with an outside provider. Patient/Guardian was advised if they have not already done so to contact the registration department to sign all necessary forms in order for [place] to release information regarding their care.   Consent: Patient/Guardian gives verbal consent for treatment and assignment of benefits for services provided during this visit. Patient/Guardian expressed understanding and agreed to proceed.    Stasia Cavalier, MD 12/26/2023, 11:35 AM   Virtual Visit via Video Note  I connected with Troy Allen on 12/26/23 at 11:00 AM EDT by a video enabled telemedicine application and verified that I am speaking with the correct person using two identifiers.  Location: Patient: Home  Provider: Home Office   I discussed the limitations of evaluation and management by telemedicine and the availability of in person appointments. The patient expressed understanding and agreed to proceed.   I discussed the assessment and treatment plan with the patient. The patient was provided an opportunity to ask questions and all were answered. The patient agreed with the plan and demonstrated an understanding of the instructions.   The patient was advised to call back or seek an  in-person evaluation if the symptoms worsen or if the condition fails to improve as anticipated.  I provided 25 minutes of non-face-to-face time during this encounter.   Stasia Cavalier, MD

## 2023-12-26 ENCOUNTER — Encounter (HOSPITAL_COMMUNITY): Payer: Self-pay | Admitting: Psychiatry

## 2023-12-26 ENCOUNTER — Telehealth (HOSPITAL_COMMUNITY): Payer: HMO | Admitting: Psychiatry

## 2023-12-26 DIAGNOSIS — F321 Major depressive disorder, single episode, moderate: Secondary | ICD-10-CM

## 2023-12-26 DIAGNOSIS — E063 Autoimmune thyroiditis: Secondary | ICD-10-CM | POA: Diagnosis not present

## 2023-12-26 DIAGNOSIS — F902 Attention-deficit hyperactivity disorder, combined type: Secondary | ICD-10-CM

## 2023-12-26 MED ORDER — AMPHETAMINE-DEXTROAMPHETAMINE 30 MG PO TABS
30.0000 mg | ORAL_TABLET | Freq: Two times a day (BID) | ORAL | 0 refills | Status: DC
Start: 2024-03-02 — End: 2024-04-02

## 2023-12-26 MED ORDER — BUPROPION HCL ER (XL) 150 MG PO TB24: 150.0000 mg | ORAL_TABLET | ORAL | 0 refills | Status: DC

## 2023-12-26 MED ORDER — AMPHETAMINE-DEXTROAMPHETAMINE 30 MG PO TABS: 30.0000 mg | ORAL_TABLET | Freq: Two times a day (BID) | ORAL | 0 refills | Status: DC

## 2023-12-26 MED ORDER — VENLAFAXINE HCL ER 37.5 MG PO CP24: 37.5000 mg | ORAL_CAPSULE | Freq: Every day | ORAL | 0 refills | Status: DC

## 2024-01-02 ENCOUNTER — Other Ambulatory Visit (HOSPITAL_COMMUNITY): Payer: Self-pay | Admitting: Psychiatry

## 2024-01-02 DIAGNOSIS — F321 Major depressive disorder, single episode, moderate: Secondary | ICD-10-CM

## 2024-01-19 ENCOUNTER — Encounter: Payer: Self-pay | Admitting: Family Medicine

## 2024-01-19 ENCOUNTER — Other Ambulatory Visit: Payer: Self-pay | Admitting: Medical Genetics

## 2024-01-19 ENCOUNTER — Ambulatory Visit (INDEPENDENT_AMBULATORY_CARE_PROVIDER_SITE_OTHER): Payer: HMO | Admitting: Family Medicine

## 2024-01-19 VITALS — BP 117/74 | HR 84 | Ht 68.0 in | Wt 159.0 lb

## 2024-01-19 DIAGNOSIS — N528 Other male erectile dysfunction: Secondary | ICD-10-CM

## 2024-01-19 DIAGNOSIS — E038 Other specified hypothyroidism: Secondary | ICD-10-CM | POA: Diagnosis not present

## 2024-01-19 DIAGNOSIS — E559 Vitamin D deficiency, unspecified: Secondary | ICD-10-CM

## 2024-01-19 DIAGNOSIS — E7849 Other hyperlipidemia: Secondary | ICD-10-CM | POA: Diagnosis not present

## 2024-01-19 DIAGNOSIS — R7301 Impaired fasting glucose: Secondary | ICD-10-CM | POA: Diagnosis not present

## 2024-01-19 DIAGNOSIS — H539 Unspecified visual disturbance: Secondary | ICD-10-CM | POA: Diagnosis not present

## 2024-01-19 DIAGNOSIS — E063 Autoimmune thyroiditis: Secondary | ICD-10-CM

## 2024-01-19 MED ORDER — SILDENAFIL CITRATE 100 MG PO TABS: 50.0000 mg | ORAL_TABLET | Freq: Every day | ORAL | 2 refills | Status: DC | PRN

## 2024-01-19 NOTE — Assessment & Plan Note (Signed)
 The patient reports difficulty seeing far away, noting an inability to focus on distant objects. Neurological and musculoskeletal examinations were unremarkable. He currently uses reading glasses and glasses for near vision. The reported balance issues do not appear to be related to vertigo, dizziness, or lightheadedness, and are more likely associated with myopia.  The patient is encouraged to follow up with his ophthalmologist. If symptoms persist after evaluation and treatment by the ophthalmologist, a referral to physical therapy for balance training will be considered.

## 2024-01-19 NOTE — Patient Instructions (Addendum)
I appreciate the opportunity to provide care to you today!    Follow up:  4 months  Labs: please stop by the lab today to get your blood drawn (CBC, CMP, TSH, Lipid profile, HgA1c, Vit D)   Please continue to a heart-healthy diet and increase your physical activities. Try to exercise for at least five days a week.    It was a pleasure to see you and I look forward to continuing to work together on your health and well-being. Please do not hesitate to call the office if you need care or have questions about your care.  In case of emergency, please visit the Emergency Department for urgent care, or contact our clinic at [phone] to schedule an appointment. We're here to help you!   Have a wonderful day and week. With Gratitude, Gilmore Laroche MSN, FNP-BC

## 2024-01-19 NOTE — Progress Notes (Signed)
 Established Patient Office Visit  Subjective:  Patient ID: Troy Allen, male    DOB: Feb 21, 1956  Age: 68 y.o. MRN: [id number]  CC:  Chief Complaint  Patient presents with   Medical Management of Chronic Issues    4 month f/u Concerns about balance.     HPI Troy Allen is a 68 y.o. male with past medical history of Hashimoto's disease, ADD, Depression and hyperlipidemia presents for f/u of  chronic medical conditions.  For the details of today's visit, please refer to the assessment and plan.     Past Medical History:  Diagnosis Date   Hashimoto's disease     Past Surgical History:  Procedure Laterality Date   HAND TENDON SURGERY Left     History reviewed. No pertinent family history.  Social History   Socioeconomic History   Marital status: Divorced    Spouse name: Not on file   Number of children: Not on file   Years of education: Not on file   Highest education level: Not on file  Occupational History   Not on file  Tobacco Use   Smoking status: Never    Passive exposure: Never   Smokeless tobacco: Never  Vaping Use   Vaping status: Former  Substance and Sexual Activity   Alcohol use: Yes    Comment: occasionally   Drug use: Yes    Types: Marijuana   Sexual activity: Yes  Other Topics Concern   Not on file  Social History Narrative   Not on file   Social Drivers of Health   Financial Resource Strain: Low Risk  (07/09/2023)   Overall Financial Resource Strain (CARDIA)    Difficulty of Paying Living Expenses: Not hard at all  Food Insecurity: No Food Insecurity (07/09/2023)   Hunger Vital Sign    Worried About Running Out of Food in the Last Year: Never true    Ran Out of Food in the Last Year: Never true  Transportation Needs: No Transportation Needs (07/09/2023)   PRAPARE - Administrator, Civil Service (Medical): No    Lack of Transportation (Non-Medical): No  Physical Activity: Sufficiently Active (07/09/2023)   Exercise  Vital Sign    Days of Exercise per Week: 7 days    Minutes of Exercise per Session: 30 min  Stress: No Stress Concern Present (07/09/2023)   [employer] of Occupational Health - Occupational Stress Questionnaire    Feeling of Stress : Not at all  Social Connections: Socially Isolated (07/09/2023)   Social Connection and Isolation Panel [NHANES]    Frequency of Communication with Friends and Family: Once a week    Frequency of Social Gatherings with Friends and Family: Once a week    Attends Religious Services: Never    Database administrator or Organizations: No    Attends Banker Meetings: Never    Marital Status: Divorced  Catering manager Violence: Not At Risk (07/09/2023)   Humiliation, Afraid, Rape, and Kick questionnaire    Fear of Current or Ex-Partner: No    Emotionally Abused: No    Physically Abused: No    Sexually Abused: No    Outpatient Medications Prior to Visit  Medication Sig Dispense Refill   amphetamine-dextroamphetamine (ADDERALL) 30 MG tablet Take 1 tablet by mouth 2 (two) times daily. 60 tablet 0   [START ON 02/02/2024] amphetamine-dextroamphetamine (ADDERALL) 30 MG tablet Take 1 tablet by mouth 2 (two) times daily. 60 tablet 0   [START ON  03/02/2024] amphetamine-dextroamphetamine (ADDERALL) 30 MG tablet Take 1 tablet by mouth 2 (two) times daily. 60 tablet 0   buPROPion (WELLBUTRIN XL) 150 MG 24 hr tablet Take 1 tablet (150 mg total) by mouth every morning. 90 tablet 0   ezetimibe (ZETIA) 10 MG tablet Take 1 tablet (10 mg total) by mouth daily. 90 tablet 3   levothyroxine (SYNTHROID) 150 MCG tablet Take 1 tablet (150 mcg total) by mouth daily. 60 tablet 1   triamcinolone ointment (KENALOG) 0.5 % Apply 1 Application topically 2 (two) times daily. 30 g 0   venlafaxine XR (EFFEXOR-XR) 37.5 MG 24 hr capsule Take 1 capsule (37.5 mg total) by mouth daily. 90 capsule 0   sildenafil (VIAGRA) 100 MG tablet Take 0.5-1 tablets (50-100 mg total) by mouth daily  as needed for erectile dysfunction. 10 tablet 2   No facility-administered medications prior to visit.    No Known Allergies  ROS Review of Systems  Constitutional:  Negative for fatigue and fever.  Eyes:  Negative for visual disturbance.  Respiratory:  Negative for chest tightness and shortness of breath.   Cardiovascular:  Negative for chest pain and palpitations.  Neurological:  Negative for dizziness and headaches.      Objective:    Physical Exam HENT:     Head: Normocephalic.     Right Ear: External ear normal.     Left Ear: External ear normal.     Nose: No congestion or rhinorrhea.     Mouth/Throat:     Mouth: Mucous membranes are moist.  Cardiovascular:     Rate and Rhythm: Regular rhythm.     Heart sounds: No murmur heard. Pulmonary:     Effort: No respiratory distress.     Breath sounds: Normal breath sounds.  Neurological:     Mental Status: He is alert and oriented to person, place, and time.     GCS: GCS eye subscore is 4. GCS verbal subscore is 5. GCS motor subscore is 6.     Cranial Nerves: No cranial nerve deficit.     Motor: No pronator drift.     Coordination: Romberg sign negative. Coordination normal. Finger-Nose-Finger Test normal.     Gait: Gait normal.     BP 117/74   Pulse 84   Ht 5\' 8"  (1.727 m)   Wt 159 lb 0.6 oz (72.1 kg)   SpO2 96%   BMI 24.18 kg/m  Wt Readings from Last 3 Encounters:  01/19/24 159 lb 0.6 oz (72.1 kg)  09/18/23 165 lb 1.3 oz (74.9 kg)  07/09/23 167 lb (75.8 kg)    Lab Results  Component Value Date   TSH 1.010 09/22/2023   Lab Results  Component Value Date   WBC 6.0 09/22/2023   HGB 12.7 (L) 09/22/2023   HCT 38.3 09/22/2023   MCV 99 (H) 09/22/2023   PLT 288 09/22/2023   Lab Results  Component Value Date   NA 139 09/22/2023   K 4.6 09/22/2023   CO2 22 09/22/2023   GLUCOSE 88 09/22/2023   BUN 13 09/22/2023   CREATININE 0.93 09/22/2023   BILITOT 0.2 09/22/2023   ALKPHOS 64 09/22/2023   AST 22  09/22/2023   ALT 18 09/22/2023   PROT 6.4 09/22/2023   ALBUMIN 4.0 09/22/2023   CALCIUM 9.0 09/22/2023   EGFR 89 09/22/2023   Lab Results  Component Value Date   CHOL 141 09/22/2023   Lab Results  Component Value Date   HDL 38 (L) 09/22/2023  Lab Results  Component Value Date   LDLCALC 88 09/22/2023   Lab Results  Component Value Date   TRIG 74 09/22/2023   Lab Results  Component Value Date   CHOLHDL 3.7 09/22/2023   Lab Results  Component Value Date   HGBA1C 5.8 (H) 09/22/2023      Assessment & Plan:  Visual disturbance Assessment & Plan: The patient reports difficulty seeing far away, noting an inability to focus on distant objects. Neurological and musculoskeletal examinations were unremarkable. He currently uses reading glasses and glasses for near vision. The reported balance issues do not appear to be related to vertigo, dizziness, or lightheadedness, and are more likely associated with myopia.  The patient is encouraged to follow up with his ophthalmologist. If symptoms persist after evaluation and treatment by the ophthalmologist, a referral to physical therapy for balance training will be considered.    Hashimoto's disease Assessment & Plan: The patient takes Synthroid 150 mcg daily and reports no complaints today. The patient is encouraged to follow up if symptoms of hypothyroidism occur despite adherence to the current treatment regimen, such as fatigue, weight gain, constipation, dry skin, cold intolerance, hair loss, or depression.  Lab Results  Component Value Date   TSH 1.010 09/22/2023      Other male erectile dysfunction -     Sildenafil Citrate; Take 0.5-1 tablets (50-100 mg total) by mouth daily as needed for erectile dysfunction.  Dispense: 10 tablet; Refill: 2  IFG (impaired fasting glucose) -     Hemoglobin A1c  Vitamin D deficiency -     VITAMIN D 25 Hydroxy (Vit-D Deficiency, Fractures)  TSH (thyroid-stimulating hormone  deficiency) -     TSH + free T4  Other hyperlipidemia -     Lipid panel -     CMP14+EGFR -     CBC with Differential/Platelet  Note: This chart has been completed using Engineer, civil (consulting) software, and while attempts have been made to ensure accuracy, certain words and phrases may not be transcribed as intended.    Follow-up: Return in about 4 months (around 05/20/2024).   Gilmore Laroche, FNP

## 2024-01-19 NOTE — Assessment & Plan Note (Signed)
 The patient takes Synthroid 150 mcg daily and reports no complaints today. The patient is encouraged to follow up if symptoms of hypothyroidism occur despite adherence to the current treatment regimen, such as fatigue, weight gain, constipation, dry skin, cold intolerance, hair loss, or depression.  Lab Results  Component Value Date   TSH 1.010 09/22/2023

## 2024-01-20 ENCOUNTER — Other Ambulatory Visit (HOSPITAL_COMMUNITY)
Admission: RE | Admit: 2024-01-20 | Discharge: 2024-01-20 | Disposition: A | Discharge status: Home / Self Care | Payer: Self-pay | Source: Ambulatory Visit | Attending: Medical Genetics | Admitting: Medical Genetics

## 2024-01-20 LAB — CBC WITH DIFFERENTIAL/PLATELET
Basophils Absolute: 0.1 10*3/uL (ref 0.0–0.2)
Basos: 1 %
EOS (ABSOLUTE): 0.3 10*3/uL (ref 0.0–0.4)
Eos: 4 %
Hematocrit: 40.5 % (ref 37.5–51.0)
Hemoglobin: 13.7 g/dL (ref 13.0–17.7)
Immature Grans (Abs): 0 10*3/uL (ref 0.0–0.1)
Immature Granulocytes: 0 %
Lymphocytes Absolute: 2.1 10*3/uL (ref 0.7–3.1)
Lymphs: 30 %
MCH: 32.8 pg (ref 26.6–33.0)
MCHC: 33.8 g/dL (ref 31.5–35.7)
MCV: 97 fL (ref 79–97)
Monocytes Absolute: 0.7 10*3/uL (ref 0.1–0.9)
Monocytes: 10 %
Neutrophils Absolute: 3.8 10*3/uL (ref 1.4–7.0)
Neutrophils: 55 %
Platelets: 306 10*3/uL (ref 150–450)
RBC: 4.18 x10E6/uL (ref 4.14–5.80)
RDW: 12.5 % (ref 11.6–15.4)
WBC: 7 10*3/uL (ref 3.4–10.8)

## 2024-01-20 LAB — CMP14+EGFR
ALT: 18 IU/L (ref 0–44)
AST: 21 IU/L (ref 0–40)
Albumin: 4.3 g/dL (ref 3.9–4.9)
Alkaline Phosphatase: 72 IU/L (ref 44–121)
BUN/Creatinine Ratio: 14 (ref 10–24)
BUN: 13 mg/dL (ref 8–27)
Bilirubin Total: 0.3 mg/dL (ref 0.0–1.2)
CO2: 22 mmol/L (ref 20–29)
Calcium: 9.4 mg/dL (ref 8.6–10.2)
Chloride: 103 mmol/L (ref 96–106)
Creatinine, Ser: 0.94 mg/dL (ref 0.76–1.27)
Globulin, Total: 2.4 g/dL (ref 1.5–4.5)
Glucose: 89 mg/dL (ref 70–99)
Potassium: 4.9 mmol/L (ref 3.5–5.2)
Sodium: 140 mmol/L (ref 134–144)
Total Protein: 6.7 g/dL (ref 6.0–8.5)
eGFR: 88 mL/min/{1.73_m2} (ref >59)

## 2024-01-20 LAB — LIPID PANEL
Chol/HDL Ratio: 3.9 ratio (ref 0.0–5.0)
Cholesterol, Total: 164 mg/dL (ref 100–199)
HDL: 42 mg/dL (ref >39)
LDL Chol Calc (NIH): 105 mg/dL — ABNORMAL HIGH (ref 0–99)
Triglycerides: 89 mg/dL (ref 0–149)
VLDL Cholesterol Cal: 17 mg/dL (ref 5–40)

## 2024-01-20 LAB — TSH+FREE T4
Free T4: 1.36 ng/dL (ref 0.82–1.77)
TSH: 0.629 u[IU]/mL (ref 0.450–4.500)

## 2024-01-20 LAB — HEMOGLOBIN A1C
Est. average glucose Bld gHb Est-mCnc: 114 mg/dL
Hgb A1c MFr Bld: 5.6 % (ref 4.8–5.6)

## 2024-01-20 LAB — VITAMIN D 25 HYDROXY (VIT D DEFICIENCY, FRACTURES): Vit D, 25-Hydroxy: 41.7 ng/mL (ref 30.0–100.0)

## 2024-01-22 ENCOUNTER — Encounter: Payer: Self-pay | Admitting: Family Medicine

## 2024-01-22 ENCOUNTER — Other Ambulatory Visit: Payer: Self-pay | Admitting: Family Medicine

## 2024-01-22 DIAGNOSIS — E063 Autoimmune thyroiditis: Secondary | ICD-10-CM

## 2024-01-22 MED ORDER — LEVOTHYROXINE SODIUM 150 MCG PO TABS: 150.0000 ug | ORAL_TABLET | Freq: Every day | ORAL | 1 refills | Status: DC

## 2024-01-25 ENCOUNTER — Encounter: Payer: Self-pay | Admitting: Family Medicine

## 2024-01-30 LAB — GENECONNECT MOLECULAR SCREEN: Genetic Analysis Overall Interpretation: NEGATIVE

## 2024-01-30 NOTE — Telephone Encounter (Signed)
 Yes, schedule with next available provider

## 2024-01-30 NOTE — Telephone Encounter (Signed)
 Spoke to pt. Describes pain as an aching, stiffness which he experiences in his knuckles and wrist of both hands but the right is significantly worse. In the left knee as well. Has been dealing with mild aching on and off for the last few years but states the aching has become more constant recently. States the pain is more severe in the mornings and experiences mild selling in the fingers in the morning as well. Describes the aching as 4/10. What do you advise? Do you advise pt be seen with available provider to discuss?

## 2024-03-11 ENCOUNTER — Ambulatory Visit (INDEPENDENT_AMBULATORY_CARE_PROVIDER_SITE_OTHER): Admitting: Family Medicine

## 2024-03-11 ENCOUNTER — Encounter: Payer: Self-pay | Admitting: Family Medicine

## 2024-03-11 VITALS — BP 131/76 | HR 85 | Resp 16 | Ht 68.0 in | Wt 153.4 lb

## 2024-03-11 DIAGNOSIS — M778 Other enthesopathies, not elsewhere classified: Secondary | ICD-10-CM

## 2024-03-11 MED ORDER — NAPROXEN 500 MG PO TABS: 500.0000 mg | ORAL_TABLET | Freq: Two times a day (BID) | ORAL | 0 refills | Status: AC | PRN

## 2024-03-11 MED ORDER — PREDNISONE 20 MG PO TABS: 40.0000 mg | ORAL_TABLET | Freq: Every day | ORAL | 0 refills | Status: AC

## 2024-03-11 NOTE — Progress Notes (Signed)
 Acute Office Visit  Subjective:    Patient ID: Troy Allen, male    DOB: 1956-05-04, 68 y.o.   MRN: [id number]  Chief Complaint  Patient presents with   Joint Pain    Right elbow pain and his finger joints, more painful in the mornings. Both hips, left knee, lower back and right shoulder.     HPI Patient is in today with the above complaints. For the details of today's visit, please refer to the assessment and plan.   Past Medical History:  Diagnosis Date   Hashimoto's disease     Past Surgical History:  Procedure Laterality Date   HAND TENDON SURGERY Left     No family history on file.  Social History   Socioeconomic History   Marital status: Divorced    Spouse name: Not on file   Number of children: Not on file   Years of education: Not on file   Highest education level: Not on file  Occupational History   Not on file  Tobacco Use   Smoking status: Never    Passive exposure: Never   Smokeless tobacco: Never  Vaping Use   Vaping status: Former  Substance and Sexual Activity   Alcohol use: Yes    Comment: occasionally   Drug use: Yes    Types: Marijuana   Sexual activity: Yes  Other Topics Concern   Not on file  Social History Narrative   Not on file   Social Drivers of Health   Financial Resource Strain: Low Risk  (07/09/2023)   Overall Financial Resource Strain (CARDIA)    Difficulty of Paying Living Expenses: Not hard at all  Food Insecurity: No Food Insecurity (07/09/2023)   Hunger Vital Sign    Worried About Running Out of Food in the Last Year: Never true    Ran Out of Food in the Last Year: Never true  Transportation Needs: No Transportation Needs (07/09/2023)   PRAPARE - Administrator, Civil Service (Medical): No    Lack of Transportation (Non-Medical): No  Physical Activity: Sufficiently Active (07/09/2023)   Exercise Vital Sign    Days of Exercise per Week: 7 days    Minutes of Exercise per Session: 30 min  Stress: No  Stress Concern Present (07/09/2023)   [employer] of Occupational Health - Occupational Stress Questionnaire    Feeling of Stress : Not at all  Social Connections: Socially Isolated (07/09/2023)   Social Connection and Isolation Panel [NHANES]    Frequency of Communication with Friends and Family: Once a week    Frequency of Social Gatherings with Friends and Family: Once a week    Attends Religious Services: Never    Database administrator or Organizations: No    Attends Banker Meetings: Never    Marital Status: Divorced  Catering manager Violence: Not At Risk (07/09/2023)   Humiliation, Afraid, Rape, and Kick questionnaire    Fear of Current or Ex-Partner: No    Emotionally Abused: No    Physically Abused: No    Sexually Abused: No    Outpatient Medications Prior to Visit  Medication Sig Dispense Refill   amphetamine -dextroamphetamine  (ADDERALL) 30 MG tablet Take 1 tablet by mouth 2 (two) times daily. 60 tablet 0   amphetamine -dextroamphetamine  (ADDERALL) 30 MG tablet Take 1 tablet by mouth 2 (two) times daily. 60 tablet 0   amphetamine -dextroamphetamine  (ADDERALL) 30 MG tablet Take 1 tablet by mouth 2 (two) times daily. 60 tablet 0  buPROPion  (WELLBUTRIN  XL) 150 MG 24 hr tablet Take 1 tablet (150 mg total) by mouth every morning. 90 tablet 0   ezetimibe  (ZETIA ) 10 MG tablet Take 1 tablet (10 mg total) by mouth daily. 90 tablet 3   levothyroxine  (SYNTHROID ) 150 MCG tablet Take 1 tablet (150 mcg total) by mouth daily. 60 tablet 1   sildenafil  (VIAGRA ) 100 MG tablet Take 0.5-1 tablets (50-100 mg total) by mouth daily as needed for erectile dysfunction. 10 tablet 2   triamcinolone  ointment (KENALOG ) 0.5 % Apply 1 Application topically 2 (two) times daily. 30 g 0   venlafaxine  XR (EFFEXOR -XR) 37.5 MG 24 hr capsule Take 1 capsule (37.5 mg total) by mouth daily. 90 capsule 0   No facility-administered medications prior to visit.    No Known Allergies  Review of  Systems  Constitutional:  Negative for fatigue and fever.  Eyes:  Negative for visual disturbance.  Respiratory:  Negative for chest tightness and shortness of breath.   Cardiovascular:  Negative for chest pain and palpitations.  Musculoskeletal:  Positive for arthralgias.  Neurological:  Negative for dizziness and headaches.       Objective:     Physical Exam HENT:     Head: Normocephalic.     Right Ear: External ear normal.     Left Ear: External ear normal.     Nose: No congestion or rhinorrhea.     Mouth/Throat:     Mouth: Mucous membranes are moist.  Cardiovascular:     Rate and Rhythm: Regular rhythm.     Heart sounds: No murmur heard. Pulmonary:     Effort: No respiratory distress.     Breath sounds: Normal breath sounds.  Neurological:     Mental Status: He is alert.     BP 131/76   Pulse 85   Resp 16   Ht 5\' 8"  (1.727 m)   Wt 153 lb 6.4 oz (69.6 kg)   SpO2 96%   BMI 23.32 kg/m  Wt Readings from Last 3 Encounters:  03/11/24 153 lb 6.4 oz (69.6 kg)  01/19/24 159 lb 0.6 oz (72.1 kg)  09/18/23 165 lb 1.3 oz (74.9 kg)       Assessment & Plan:  Tendonitis of elbow, right Assessment & Plan: Tendonitis: -Start Prednisone  40 mg daily for 5 days to reduce inflammation. -After completing the prednisone  course, you may take Naproxen  500 mg twice daily as needed for ongoing pain or inflammation.  Activity Modification: -Avoid repetitive motions or heavy lifting that worsen symptoms. -Consider temporary rest or modified duties if work-related. -Apply ice to the elbow for 15-20 minutes several times per day.  Supportive Measures: -Recommend use of an elbow brace or forearm strap to reduce strain. -Stretching and strengthening exercises may be introduced as pain improves (e.g., wrist flexor/extensor stretches).    Orders: -     predniSONE ; Take 2 tablets (40 mg total) by mouth daily for 5 days.  Dispense: 10 tablet; Refill: 0 -     Naproxen ; Take 1 tablet  (500 mg total) by mouth 2 (two) times daily as needed.  Dispense: 60 tablet; Refill: 0  Note: This chart has been completed using Engineer, civil (consulting) software, and while attempts have been made to ensure accuracy, certain words and phrases may not be transcribed as intended.    Grey Rakestraw, FNP

## 2024-03-11 NOTE — Patient Instructions (Addendum)
 I appreciate the opportunity to provide care to you today!   Tendonitis: -Start Prednisone  40 mg daily for 5 days to reduce inflammation. -After completing the prednisone  course, you may take Naproxen  500 mg twice daily as needed for ongoing pain or inflammation.  Activity Modification: -Avoid repetitive motions or heavy lifting that worsen symptoms. -Consider temporary rest or modified duties if work-related. -Apply ice to the elbow for 15-20 minutes several times per day.  Supportive Measures: -Recommend use of an elbow brace or forearm strap to reduce strain. -Stretching and strengthening exercises may be introduced as pain improves (e.g., wrist flexor/extensor stretches).   Please follow up If no improvement.  Attached with your AVS, you will find valuable resources for self-education. I highly recommend dedicating some time to thoroughly examine them.   Please continue to a heart-healthy diet and increase your physical activities. Try to exercise for at least five days a week.    It was a pleasure to see you and I look forward to continuing to work together on your health and well-being. Please do not hesitate to call the office if you need care or have questions about your care.  In case of emergency, please visit the Emergency Department for urgent care, or contact our clinic at [phone] to schedule an appointment. We're here to help you!   Have a wonderful day and week. With Gratitude, Terrence Wishon MSN, FNP-BC

## 2024-03-15 DIAGNOSIS — M778 Other enthesopathies, not elsewhere classified: Secondary | ICD-10-CM | POA: Insufficient documentation

## 2024-03-15 NOTE — Assessment & Plan Note (Signed)
 Tendonitis: -Start Prednisone  40 mg daily for 5 days to reduce inflammation. -After completing the prednisone  course, you may take Naproxen  500 mg twice daily as needed for ongoing pain or inflammation.  Activity Modification: -Avoid repetitive motions or heavy lifting that worsen symptoms. -Consider temporary rest or modified duties if work-related. -Apply ice to the elbow for 15-20 minutes several times per day.  Supportive Measures: -Recommend use of an elbow brace or forearm strap to reduce strain. -Stretching and strengthening exercises may be introduced as pain improves (e.g., wrist flexor/extensor stretches).

## 2024-03-29 NOTE — Progress Notes (Signed)
 [facility] MD/PA/NP OP Progress Note  04/02/2024 11:19 AM Troy Allen  MRN:  [id number]  Visit Diagnosis:    ICD-10-CM   1. Attention deficit hyperactivity disorder (ADHD), combined type  F90.2 amphetamine -dextroamphetamine  (ADDERALL) 30 MG tablet    amphetamine -dextroamphetamine  (ADDERALL) 30 MG tablet    amphetamine -dextroamphetamine  (ADDERALL) 30 MG tablet    2. Current moderate episode of major depressive disorder, unspecified whether recurrent (HCC)  F32.1 venlafaxine  XR (EFFEXOR -XR) 37.5 MG 24 hr capsule    buPROPion  (WELLBUTRIN  XL) 150 MG 24 hr tablet       Assessment:  Troy Allen is a 68 y.o. male with a history of depression, hashimoto's disease, and reported ADD who presented to [facility] for initial evaluation on 08/13/2022.  During initial evaluation patient reported a history of depression and ADHD though the depression has been well controlled.  He reported experiencing symptoms of fatigue, low mood, amotivation, and anhedonia during periods of depression.  He had 1 instance of passive SI with no plan or intent over 20 years ago.  He denied any SI/HI or thoughts of self-harm at this time along with any history of mania, psychosis, paranoia, or delusions.  Patient also endorsed symptoms of poor concentration, increased sluggishness, fatigue, and impaired memory.  Patient felt these symptoms improved when he had taken Adderall in the past.  Of note patient has a past history of Hashimoto's disease which he is on Synthroid  and TSH is currently within normal limits.  Patient met criteria for MDD currently stable and neuropsych testing was positive for ADHD.    Troy Allen presents for follow-up evaluation. Today, 04/02/24, patient reports that mood and ADHD symptoms have been stable.  He denies any adverse side effects. We will continue on current regimen and follow-up in 3 months.  Plan: - Continue Venlafaxine  37.5 mg QD - Continue  Bupropion  XL 150 mg daily   - Continue Adderall 30 mg BID - CMP, CBC, lipid profile, vitamin D , TSH, and free T4 reviewed - Neuropsych testing completed and reviewed, patient to send in a copy - Sleep specialist referral for sleep apnea - Crisis resources reviewed - Therapy referral - Insurance letter completed - Follow up in 3 months  Chief Complaint:  Chief Complaint  Patient presents with   Follow-up   HPI: Troy Allen presents reporting that things have been stable for him over the past 3 months. He has been working on drawings in the interim. He had wanted to be an artist growing up and now has the chance to try it again. He reflected on his mental state in the past when he was trying to become an artist vs. now.   Mood wise he has been stable without any concerns. His attention and concentration have been well controlled on the medicine. He has not experienced any adverse side effects.   Past Psychiatric History: Patient's psychiatric care started in 1995 first with therapy than medication.  He reports trying a number of medications but only remembers Prozac, bupropion , and Effexor  in the antidepressant realm.  He has also only tried Adderall as far stimulants ago.  He denies any prior suicide attempts or psychiatric hospitalizations.  Bupropion  XL 150 mg started on 08/13/22 and increased to 300 mg on 10/21/2022  Patient reports having half a glass of wine with dinner and using pot occasionally around once or twice a week.  He notes that he does smoke nicotine but wants to quit.    Past Medical  History:  Past Medical History:  Diagnosis Date   Hashimoto's disease     Past Surgical History:  Procedure Laterality Date   HAND TENDON SURGERY Left     Family Psychiatric History: His brother has depression and committed suicide  Family History: History reviewed. No pertinent family history.  Social History:  Social History   Socioeconomic History   Marital status: Divorced     Spouse name: Not on file   Number of children: Not on file   Years of education: Not on file   Highest education level: Not on file  Occupational History   Not on file  Tobacco Use   Smoking status: Never    Passive exposure: Never   Smokeless tobacco: Never  Vaping Use   Vaping status: Former  Substance and Sexual Activity   Alcohol use: Yes    Comment: occasionally   Drug use: Yes    Types: Marijuana   Sexual activity: Yes  Other Topics Concern   Not on file  Social History Narrative   Not on file   Social Drivers of Health   Financial Resource Strain: Low Risk  (07/09/2023)   Overall Financial Resource Strain (CARDIA)    Difficulty of Paying Living Expenses: Not hard at all  Food Insecurity: No Food Insecurity (07/09/2023)   Hunger Vital Sign    Worried About Running Out of Food in the Last Year: Never true    Ran Out of Food in the Last Year: Never true  Transportation Needs: No Transportation Needs (07/09/2023)   PRAPARE - Administrator, Civil Service (Medical): No    Lack of Transportation (Non-Medical): No  Physical Activity: Sufficiently Active (07/09/2023)   Exercise Vital Sign    Days of Exercise per Week: 7 days    Minutes of Exercise per Session: 30 min  Stress: No Stress Concern Present (07/09/2023)   Troy Allen of Occupational Health - Occupational Stress Questionnaire    Feeling of Stress : Not at all  Social Connections: Socially Isolated (07/09/2023)   Social Connection and Isolation Panel    Frequency of Communication with Friends and Family: Once a week    Frequency of Social Gatherings with Friends and Family: Once a week    Attends Religious Services: Never    Database administrator or Organizations: No    Attends Engineer, structural: Never    Marital Status: Divorced    Allergies: No Known Allergies  Current Medications: Current Outpatient Medications  Medication Sig Dispense Refill   [START ON 04/14/2024]  amphetamine -dextroamphetamine  (ADDERALL) 30 MG tablet Take 1 tablet by mouth 2 (two) times daily. 60 tablet 0   [START ON 05/14/2024] amphetamine -dextroamphetamine  (ADDERALL) 30 MG tablet Take 1 tablet by mouth 2 (two) times daily. 60 tablet 0   [START ON 06/14/2024] amphetamine -dextroamphetamine  (ADDERALL) 30 MG tablet Take 1 tablet by mouth 2 (two) times daily. 60 tablet 0   buPROPion  (WELLBUTRIN  XL) 150 MG 24 hr tablet Take 1 tablet (150 mg total) by mouth every morning. 90 tablet 0   ezetimibe  (ZETIA ) 10 MG tablet Take 1 tablet (10 mg total) by mouth daily. 90 tablet 3   levothyroxine  (SYNTHROID ) 150 MCG tablet Take 1 tablet (150 mcg total) by mouth daily. 60 tablet 1   naproxen  (NAPROSYN ) 500 MG tablet Take 1 tablet (500 mg total) by mouth 2 (two) times daily as needed. 60 tablet 0   sildenafil  (VIAGRA ) 100 MG tablet Take 0.5-1 tablets (50-100 mg total) by  mouth daily as needed for erectile dysfunction. 10 tablet 2   triamcinolone  ointment (KENALOG ) 0.5 % Apply 1 Application topically 2 (two) times daily. 30 g 0   venlafaxine  XR (EFFEXOR -XR) 37.5 MG 24 hr capsule Take 1 capsule (37.5 mg total) by mouth daily. 90 capsule 0   No current facility-administered medications for this visit.     Psychiatric Specialty Exam: Review of Systems  There were no vitals taken for this visit.There is no height or weight on file to calculate BMI.  General Appearance: Fairly Groomed  Eye Contact:  Good  Speech:  Clear and Coherent and Normal Rate  Volume:  Normal  Mood:  Euthymic  Affect:  Congruent  Thought Process:  Coherent  Orientation:  Full (Time, Place, and Person)  Thought Content: Logical   Suicidal Thoughts:  No  Homicidal Thoughts:  No  Memory:  NA  Judgement:  Good  Insight:  Good  Psychomotor Activity:  Normal  Concentration:  Concentration: Good  Recall:  Good  Fund of Knowledge: Fair  Language: Good  Akathisia:  NA    AIMS (if indicated): not done  Assets:  Communication  Skills Desire for Improvement Housing Physical Health  ADL's:  Intact  Cognition: WNL  Sleep:  Good   Metabolic Disorder Labs: Lab Results  Component Value Date   HGBA1C 5.6 01/19/2024   No results found for: PROLACTIN Lab Results  Component Value Date   CHOL 164 01/19/2024   TRIG 89 01/19/2024   HDL 42 01/19/2024   CHOLHDL 3.9 01/19/2024   LDLCALC 105 (H) 01/19/2024   LDLCALC 88 09/22/2023   Lab Results  Component Value Date   TSH 0.629 01/19/2024   TSH 1.010 09/22/2023    Therapeutic Level Labs: No results found for: LITHIUM No results found for: VALPROATE No results found for: CBMZ   Screenings: GAD-7    Flowsheet Row Office Visit from 09/18/2023 in [facility] Office Visit from 05/19/2023 in [facility] Office Visit from 09/04/2022 in [facility] Office Visit from 08/13/2022 in [facility]  Total GAD-7 Score 0 0 0 6   PHQ2-9    Flowsheet Row Office Visit from 03/11/2024 in [facility] Office Visit from 01/19/2024 in [facility] Office Visit from 09/18/2023 in [facility] Clinical Support from 07/09/2023 in [facility] Office Visit from 05/19/2023 in [facility]  PHQ-2 Total Score 0 0 0 0 1  PHQ-9 Total Score -- -- 0 -- 6   Flowsheet Row Office Visit [facility] 08/[facility]/2022 [facility] ED from 03/31/2022 in [facility] Emergency Department at [facility]  C-SSRS RISK CATEGORY No Risk No Risk    Collaboration of Care: Collaboration of Care: Medication Management AEB medication prescription and Primary Care Provider AEB chart review  Patient/Guardian was advised Release of Information must be obtained prior to any record release in order to collaborate their care with an outside provider.  Patient/Guardian was advised if they have not already done so to contact the registration department to sign all necessary forms in order for us  to release information regarding their care.   Consent: Patient/Guardian gives verbal consent for treatment and assignment of benefits for services provided during this visit. Patient/Guardian expressed understanding and agreed to proceed.    Yves Herb, MD 04/02/2024, 11:19 AM   Virtual Visit via  Video Note  I connected with Ames Justin on 04/02/24 at 11:00 AM EDT by a video enabled telemedicine application and verified that I am speaking with the correct person using two identifiers.  Location: Patient: Home  Provider: Home Office   I discussed the limitations of evaluation and management by telemedicine and the availability of in person appointments. The patient expressed understanding and agreed to proceed.   I discussed the assessment and treatment plan with the patient. The patient was provided an opportunity to ask questions and all were answered. The patient agreed with the plan and demonstrated an understanding of the instructions.   The patient was advised to call back or seek an in-person evaluation if the symptoms worsen or if the condition fails to improve as anticipated.  I provided 25 minutes of non-face-to-face time during this encounter.   Yves Herb, MD

## 2024-04-02 ENCOUNTER — Encounter (HOSPITAL_COMMUNITY): Payer: Self-pay | Admitting: Psychiatry

## 2024-04-02 ENCOUNTER — Telehealth (HOSPITAL_COMMUNITY): Admitting: Psychiatry

## 2024-04-02 DIAGNOSIS — F902 Attention-deficit hyperactivity disorder, combined type: Secondary | ICD-10-CM | POA: Diagnosis not present

## 2024-04-02 DIAGNOSIS — F321 Major depressive disorder, single episode, moderate: Secondary | ICD-10-CM | POA: Diagnosis not present

## 2024-04-02 MED ORDER — AMPHETAMINE-DEXTROAMPHETAMINE 30 MG PO TABS: 30.0000 mg | ORAL_TABLET | Freq: Two times a day (BID) | ORAL | 0 refills | Status: DC

## 2024-04-02 MED ORDER — VENLAFAXINE HCL ER 37.5 MG PO CP24
37.5000 mg | ORAL_CAPSULE | Freq: Every day | ORAL | 0 refills | Status: DC
Start: 2024-04-02 — End: 2024-07-02

## 2024-04-02 MED ORDER — BUPROPION HCL ER (XL) 150 MG PO TB24: 150.0000 mg | ORAL_TABLET | ORAL | 0 refills | Status: DC

## 2024-05-20 ENCOUNTER — Ambulatory Visit (INDEPENDENT_AMBULATORY_CARE_PROVIDER_SITE_OTHER): Admitting: Family Medicine

## 2024-05-20 ENCOUNTER — Encounter: Payer: Self-pay | Admitting: Family Medicine

## 2024-05-20 VITALS — BP 103/68 | HR 70 | Ht 68.0 in | Wt 164.0 lb

## 2024-05-20 DIAGNOSIS — R7301 Impaired fasting glucose: Secondary | ICD-10-CM

## 2024-05-20 DIAGNOSIS — E063 Autoimmune thyroiditis: Secondary | ICD-10-CM | POA: Diagnosis not present

## 2024-05-20 DIAGNOSIS — E7849 Other hyperlipidemia: Secondary | ICD-10-CM | POA: Diagnosis not present

## 2024-05-20 DIAGNOSIS — E038 Other specified hypothyroidism: Secondary | ICD-10-CM | POA: Diagnosis not present

## 2024-05-20 DIAGNOSIS — E559 Vitamin D deficiency, unspecified: Secondary | ICD-10-CM | POA: Diagnosis not present

## 2024-05-20 DIAGNOSIS — E785 Hyperlipidemia, unspecified: Secondary | ICD-10-CM | POA: Diagnosis not present

## 2024-05-20 NOTE — Patient Instructions (Addendum)
 I appreciate the opportunity to provide care to you today!    Follow up:  4 months  Labs: please stop by the lab today to get your blood drawn (CBC, CMP, TSH, Lipid profile, HgA1c, Vit D)  Medicare Annual Wellness  For a Healthier YOU, I Recommend: Reducing your intake of sugar, sodium, carbohydrates, and saturated fats. Increasing your fiber intake by incorporating more whole grains, fruits, and vegetables into your meals. Setting healthy goals with a focus on lowering your consumption of carbs, sugar, and unhealthy fats. Adding variety to your diet by including a wide range of fruits and vegetables. Cutting back on soda and limiting processed foods as much as possible. Staying active: In addition to taking your weight loss medication, aim for at least 150 minutes of moderate-intensity physical activity each week for optimal results.   Please follow up if your symptoms worsen or fail to improve.    Please continue to a heart-healthy diet and increase your physical activities. Try to exercise for at least five days a week.    It was a pleasure to see you and I look forward to continuing to work together on your health and well-being. Please do not hesitate to call the office if you need care or have questions about your care.  In case of emergency, please visit the Emergency Department for urgent care, or contact our clinic at [phone] to schedule an appointment. We're here to help you!   Have a wonderful day and week. With Gratitude, Matix Henshaw MSN, FNP-BC

## 2024-05-20 NOTE — Assessment & Plan Note (Signed)
 The patient takes Synthroid  150 mcg daily and reports no complaints today. The patient is encouraged to follow up if symptoms of hypothyroidism occur despite adherence to the current treatment regimen, such as fatigue, weight gain, constipation, dry skin, cold intolerance, hair loss, or depression.  Lab Results  Component Value Date   TSH 0.629 01/19/2024

## 2024-05-20 NOTE — Progress Notes (Signed)
 Established Patient Office Visit  Subjective:  Patient ID: Troy Allen, male    DOB: January 15, 1956  Age: 68 y.o. MRN: [id number]  CC:  Chief Complaint  Patient presents with   Medical Management of Chronic Issues    4 month follow up    HPI Troy Allen is a 68 y.o. male with past medical history of Hashimoto's disease, hyperlipidemia, erectile dysfunction, depression presents for f/u of  chronic medical conditions.  For the details of today's visit, please refer to the assessment and plan.     Past Medical History:  Diagnosis Date   Hashimoto's disease     Past Surgical History:  Procedure Laterality Date   HAND TENDON SURGERY Left     No family history on file.  Social History   Socioeconomic History   Marital status: Divorced    Spouse name: Not on file   Number of children: Not on file   Years of education: Not on file   Highest education level: Not on file  Occupational History   Not on file  Tobacco Use   Smoking status: Never    Passive exposure: Never   Smokeless tobacco: Never  Vaping Use   Vaping status: Former  Substance and Sexual Activity   Alcohol use: Yes    Comment: occasionally   Drug use: Yes    Types: Marijuana   Sexual activity: Yes  Other Topics Concern   Not on file  Social History Narrative   Not on file   Social Drivers of Health   Financial Resource Strain: Low Risk  (07/09/2023)   Overall Financial Resource Strain (CARDIA)    Difficulty of Paying Living Expenses: Not hard at all  Food Insecurity: No Food Insecurity (07/09/2023)   Hunger Vital Sign    Worried About Running Out of Food in the Last Year: Never true    Ran Out of Food in the Last Year: Never true  Transportation Needs: No Transportation Needs (07/09/2023)   PRAPARE - Administrator, Civil Service (Medical): No    Lack of Transportation (Non-Medical): No  Physical Activity: Sufficiently Active (07/09/2023)   Exercise Vital Sign    Days of  Exercise per Week: 7 days    Minutes of Exercise per Session: 30 min  Stress: No Stress Concern Present (07/09/2023)   [employer] of Occupational Health - Occupational Stress Questionnaire    Feeling of Stress : Not at all  Social Connections: Socially Isolated (07/09/2023)   Social Connection and Isolation Panel    Frequency of Communication with Friends and Family: Once a week    Frequency of Social Gatherings with Friends and Family: Once a week    Attends Religious Services: Never    Database administrator or Organizations: No    Attends Banker Meetings: Never    Marital Status: Divorced  Catering manager Violence: Not At Risk (07/09/2023)   Humiliation, Afraid, Rape, and Kick questionnaire    Fear of Current or Ex-Partner: No    Emotionally Abused: No    Physically Abused: No    Sexually Abused: No    Outpatient Medications Prior to Visit  Medication Sig Dispense Refill   amphetamine -dextroamphetamine  (ADDERALL) 30 MG tablet Take 1 tablet by mouth 2 (two) times daily. 60 tablet 0   amphetamine -dextroamphetamine  (ADDERALL) 30 MG tablet Take 1 tablet by mouth 2 (two) times daily. 60 tablet 0   [START ON 06/14/2024] amphetamine -dextroamphetamine  (ADDERALL) 30 MG tablet Take 1  tablet by mouth 2 (two) times daily. 60 tablet 0   buPROPion  (WELLBUTRIN  XL) 150 MG 24 hr tablet Take 1 tablet (150 mg total) by mouth every morning. 90 tablet 0   ezetimibe  (ZETIA ) 10 MG tablet Take 1 tablet (10 mg total) by mouth daily. 90 tablet 3   levothyroxine  (SYNTHROID ) 150 MCG tablet Take 1 tablet (150 mcg total) by mouth daily. 60 tablet 1   naproxen  (NAPROSYN ) 500 MG tablet Take 1 tablet (500 mg total) by mouth 2 (two) times daily as needed. 60 tablet 0   sildenafil  (VIAGRA ) 100 MG tablet Take 0.5-1 tablets (50-100 mg total) by mouth daily as needed for erectile dysfunction. 10 tablet 2   triamcinolone  ointment (KENALOG ) 0.5 % Apply 1 Application topically 2 (two) times daily. 30 g 0    venlafaxine  XR (EFFEXOR -XR) 37.5 MG 24 hr capsule Take 1 capsule (37.5 mg total) by mouth daily. 90 capsule 0   No facility-administered medications prior to visit.    No Known Allergies  ROS Review of Systems  Constitutional:  Negative for fatigue and fever.  Eyes:  Negative for visual disturbance.  Respiratory:  Negative for chest tightness and shortness of breath.   Cardiovascular:  Negative for chest pain and palpitations.  Neurological:  Negative for dizziness and headaches.      Objective:    Physical Exam HENT:     Head: Normocephalic.     Right Ear: External ear normal.     Left Ear: External ear normal.     Nose: No congestion or rhinorrhea.     Mouth/Throat:     Mouth: Mucous membranes are moist.  Cardiovascular:     Rate and Rhythm: Regular rhythm.     Heart sounds: No murmur heard. Pulmonary:     Effort: No respiratory distress.     Breath sounds: Normal breath sounds.  Neurological:     Mental Status: He is alert.     BP 103/68   Pulse 70   Ht 5' 8 (1.727 m)   Wt 164 lb 0.6 oz (74.4 kg)   SpO2 97%   BMI 24.94 kg/m  Wt Readings from Last 3 Encounters:  05/20/24 164 lb 0.6 oz (74.4 kg)  03/11/24 153 lb 6.4 oz (69.6 kg)  01/19/24 159 lb 0.6 oz (72.1 kg)    Lab Results  Component Value Date   TSH 0.629 01/19/2024   Lab Results  Component Value Date   WBC 7.0 01/19/2024   HGB 13.7 01/19/2024   HCT 40.5 01/19/2024   MCV 97 01/19/2024   PLT 306 01/19/2024   Lab Results  Component Value Date   NA 140 01/19/2024   K 4.9 01/19/2024   CO2 22 01/19/2024   GLUCOSE 89 01/19/2024   BUN 13 01/19/2024   CREATININE 0.94 01/19/2024   BILITOT 0.3 01/19/2024   ALKPHOS 72 01/19/2024   AST 21 01/19/2024   ALT 18 01/19/2024   PROT 6.7 01/19/2024   ALBUMIN 4.3 01/19/2024   CALCIUM  9.4 01/19/2024   EGFR 88 01/19/2024   Lab Results  Component Value Date   CHOL 164 01/19/2024   Lab Results  Component Value Date   HDL 42 01/19/2024   Lab  Results  Component Value Date   LDLCALC 105 (H) 01/19/2024   Lab Results  Component Value Date   TRIG 89 01/19/2024   Lab Results  Component Value Date   CHOLHDL 3.9 01/19/2024   Lab Results  Component Value Date   HGBA1C  5.6 01/19/2024      Assessment & Plan:  Hashimoto's disease Assessment & Plan: The patient takes Synthroid  150 mcg daily and reports no complaints today. The patient is encouraged to follow up if symptoms of hypothyroidism occur despite adherence to the current treatment regimen, such as fatigue, weight gain, constipation, dry skin, cold intolerance, hair loss, or depression.  Lab Results  Component Value Date   TSH 0.629 01/19/2024      Hyperlipidemia LDL goal <100 Assessment & Plan: He takes ezetimibe  10 mg daily Encouraged to Increase his intake of foods rich in fruits, vegetables, whole grains Lean proteins: chicken, fish, beans, legumes Low Fat dairy products Reduced intake of saturated fats, trans fatty acids, cholesterol Aim to be active at least 5 days a week for 30 minutes each day ( walking briskly)  Pending lipid panel Lab Results  Component Value Date   CHOL 164 01/19/2024   HDL 42 01/19/2024   LDLCALC 105 (H) 01/19/2024   TRIG 89 01/19/2024   CHOLHDL 3.9 01/19/2024      IFG (impaired fasting glucose) -     Hemoglobin A1c  Vitamin D  deficiency -     VITAMIN D  25 Hydroxy (Vit-D Deficiency, Fractures)  TSH (thyroid -stimulating hormone deficiency) -     TSH + free T4  Other hyperlipidemia -     Lipid panel -     CMP14+EGFR -     CBC with Differential/Platelet  Note: This chart has been completed using Engineer, civil (consulting) software, and while attempts have been made to ensure accuracy, certain words and phrases may not be transcribed as intended.    Follow-up: Return in about 4 months (around 09/19/2024).   Bailee Metter, FNP

## 2024-05-20 NOTE — Assessment & Plan Note (Signed)
 He takes ezetimibe  10 mg daily Encouraged to Increase his intake of foods rich in fruits, vegetables, whole grains Lean proteins: chicken, fish, beans, legumes Low Fat dairy products Reduced intake of saturated fats, trans fatty acids, cholesterol Aim to be active at least 5 days a week for 30 minutes each day ( walking briskly)  Pending lipid panel Lab Results  Component Value Date   CHOL 164 01/19/2024   HDL 42 01/19/2024   LDLCALC 105 (H) 01/19/2024   TRIG 89 01/19/2024   CHOLHDL 3.9 01/19/2024

## 2024-05-21 ENCOUNTER — Ambulatory Visit: Payer: Self-pay | Admitting: Family Medicine

## 2024-05-21 LAB — HEMOGLOBIN A1C
Est. average glucose Bld gHb Est-mCnc: 111 mg/dL
Hgb A1c MFr Bld: 5.5 % (ref 4.8–5.6)

## 2024-05-21 LAB — CMP14+EGFR
ALT: 19 IU/L (ref 0–44)
AST: 22 IU/L (ref 0–40)
Albumin: 4.3 g/dL (ref 3.9–4.9)
Alkaline Phosphatase: 68 IU/L (ref 44–121)
BUN/Creatinine Ratio: 16 (ref 10–24)
BUN: 14 mg/dL (ref 8–27)
Bilirubin Total: 0.3 mg/dL (ref 0.0–1.2)
CO2: 23 mmol/L (ref 20–29)
Calcium: 9.6 mg/dL (ref 8.6–10.2)
Chloride: 103 mmol/L (ref 96–106)
Creatinine, Ser: 0.89 mg/dL (ref 0.76–1.27)
Globulin, Total: 2.4 g/dL (ref 1.5–4.5)
Glucose: 89 mg/dL (ref 70–99)
Potassium: 5.4 mmol/L — ABNORMAL HIGH (ref 3.5–5.2)
Sodium: 138 mmol/L (ref 134–144)
Total Protein: 6.7 g/dL (ref 6.0–8.5)
eGFR: 93 mL/min/1.73 (ref >59)

## 2024-05-21 LAB — CBC WITH DIFFERENTIAL/PLATELET
Basophils Absolute: 0.1 x10E3/uL (ref 0.0–0.2)
Basos: 1 %
EOS (ABSOLUTE): 0.3 x10E3/uL (ref 0.0–0.4)
Eos: 3 %
Hematocrit: 41.9 % (ref 37.5–51.0)
Hemoglobin: 14 g/dL (ref 13.0–17.7)
Immature Grans (Abs): 0 x10E3/uL (ref 0.0–0.1)
Immature Granulocytes: 0 %
Lymphocytes Absolute: 2.1 x10E3/uL (ref 0.7–3.1)
Lymphs: 28 %
MCH: 33.7 pg — ABNORMAL HIGH (ref 26.6–33.0)
MCHC: 33.4 g/dL (ref 31.5–35.7)
MCV: 101 fL — ABNORMAL HIGH (ref 79–97)
Monocytes Absolute: 0.6 x10E3/uL (ref 0.1–0.9)
Monocytes: 9 %
Neutrophils Absolute: 4.3 x10E3/uL (ref 1.4–7.0)
Neutrophils: 59 %
Platelets: 312 x10E3/uL (ref 150–450)
RBC: 4.16 x10E6/uL (ref 4.14–5.80)
RDW: 12.9 % (ref 11.6–15.4)
WBC: 7.3 x10E3/uL (ref 3.4–10.8)

## 2024-05-21 LAB — LIPID PANEL
Chol/HDL Ratio: 4 ratio (ref 0.0–5.0)
Cholesterol, Total: 167 mg/dL (ref 100–199)
HDL: 42 mg/dL (ref >39)
LDL Chol Calc (NIH): 107 mg/dL — ABNORMAL HIGH (ref 0–99)
Triglycerides: 97 mg/dL (ref 0–149)
VLDL Cholesterol Cal: 18 mg/dL (ref 5–40)

## 2024-05-21 LAB — TSH+FREE T4
Free T4: 1.05 ng/dL (ref 0.82–1.77)
TSH: 1.32 u[IU]/mL (ref 0.450–4.500)

## 2024-05-21 LAB — VITAMIN D 25 HYDROXY (VIT D DEFICIENCY, FRACTURES): Vit D, 25-Hydroxy: 45 ng/mL (ref 30.0–100.0)

## 2024-06-28 NOTE — Progress Notes (Unsigned)
 BH MD/PA/NP OP Progress Note  07/02/2024 11:31 AM Troy Allen  MRN:  [id number]  Visit Diagnosis:    ICD-10-CM   1. Attention deficit hyperactivity disorder (ADHD), combined type  F90.2 amphetamine -dextroamphetamine  (ADDERALL) 30 MG tablet    amphetamine -dextroamphetamine  (ADDERALL) 30 MG tablet    amphetamine -dextroamphetamine  (ADDERALL) 30 MG tablet    2. Current moderate episode of major depressive disorder, unspecified whether recurrent (HCC)  F32.1 buPROPion  (WELLBUTRIN  XL) 150 MG 24 hr tablet    venlafaxine  XR (EFFEXOR -XR) 37.5 MG 24 hr capsule      Assessment:  Troy Allen is a 68 y.o. male with a history of depression, hashimoto's disease, and reported ADD who presented to [facility] for initial evaluation on 08/13/2022.  During initial evaluation patient reported a history of depression and ADHD though the depression has been well controlled.  He reported experiencing symptoms of fatigue, low mood, amotivation, and anhedonia during periods of depression.  He had 1 instance of passive SI with no plan or intent over 20 years ago.  He denied any SI/HI or thoughts of self-harm at this time along with any history of mania, psychosis, paranoia, or delusions.  Patient also endorsed symptoms of poor concentration, increased sluggishness, fatigue, and impaired memory.  Patient felt these symptoms improved when he had taken Adderall in the past.  Of note patient has a past history of Hashimoto's disease which he is on Synthroid  and TSH is currently within normal limits.  Patient met criteria for MDD currently stable and neuropsych testing was positive for ADHD.    Troy Allen presents for follow-up evaluation. Today, 07/02/24, patient reports that mood and ADHD symptoms have been stable with consistent medication compliance.  He denies any adverse medication side effects. We will continue on current regimen and follow-up in 3  months.  Plan: - Continue Venlafaxine  37.5 mg QD - Continue Bupropion  XL 150 mg daily   - Continue Adderall 30 mg BID - CMP, CBC, lipid profile, vitamin D , TSH, and free T4 reviewed - Neuropsych testing completed and reviewed, patient to send in a copy - Sleep specialist referral for sleep apnea - Crisis resources reviewed - Therapy referral - Insurance letter completed - Follow up in 3 months  Chief Complaint:  Chief Complaint  Patient presents with   Follow-up   HPI: [name] presents reporting that things have been good over the last 3 months.  He denies any changes of note other than update to his pharmacy.  Looking forward he is going to be visiting his grandson in [place] for [employer].  His middle son Troy Allen's birthday and grandson's father is around that time as well.  We discussed a bit about his middle son and the divorce he went through 6 months ago.  Looking back to Troy Allen has a hard time not comparing his son situation to his own.  He feels tha his son and son's ex-wife handled the divorce much better than Troy Allen and his ex-wife did particularly in relationship to the kids.  Patient still endorses some regrets about his actions back then and the negative impact it had on his kids/their relationship.  Things are better with his sons now however it took some time for the relationship to be repaired.  Patient has some thoughts about talking with his son about his own experience particularly when looking for a new partner postdivorce given that he has been through a lot in that field before finding was right for  him.  Mood wise he has been stable without any concerns. His attention and concentration have been well controlled on the medicine. He has not experienced any adverse side effects.   Past Psychiatric History: Patient's psychiatric care started in 1995 first with therapy than medication.  He reports trying a number of medications but only remembers Prozac, bupropion , and  Effexor  in the antidepressant realm.  He has also only tried Adderall as far stimulants ago.  He denies any prior suicide attempts or psychiatric hospitalizations.  Bupropion  XL 150 mg started on 08/13/22 and increased to 300 mg on 10/21/2022  Patient reports having half a glass of wine with dinner and using pot occasionally around once or twice a week.  He notes that he does smoke nicotine but wants to quit.    Past Medical History:  Past Medical History:  Diagnosis Date   Hashimoto's disease     Past Surgical History:  Procedure Laterality Date   HAND TENDON SURGERY Left     Family Psychiatric History: His brother has depression and committed suicide  Family History: No family history on file.  Social History:  Social History   Socioeconomic History   Marital status: Divorced    Spouse name: Not on file   Number of children: Not on file   Years of education: Not on file   Highest education level: Not on file  Occupational History   Not on file  Tobacco Use   Smoking status: Never    Passive exposure: Never   Smokeless tobacco: Never  Vaping Use   Vaping status: Former  Substance and Sexual Activity   Alcohol use: Yes    Comment: occasionally   Drug use: Yes    Types: Marijuana   Sexual activity: Yes  Other Topics Concern   Not on file  Social History Narrative   Not on file   Social Drivers of Health   Financial Resource Strain: Low Risk  (07/09/2023)   Overall Financial Resource Strain (CARDIA)    Difficulty of Paying Living Expenses: Not hard at all  Food Insecurity: No Food Insecurity (07/09/2023)   Hunger Vital Sign    Worried About Running Out of Food in the Last Year: Never true    Ran Out of Food in the Last Year: Never true  Transportation Needs: No Transportation Needs (07/09/2023)   PRAPARE - Administrator, Civil Service (Medical): No    Lack of Transportation (Non-Medical): No  Physical Activity: Sufficiently Active (07/09/2023)    Exercise Vital Sign    Days of Exercise per Week: 7 days    Minutes of Exercise per Session: 30 min  Stress: No Stress Concern Present (07/09/2023)   [employer] of Occupational Health - Occupational Stress Questionnaire    Feeling of Stress : Not at all  Social Connections: Socially Isolated (07/09/2023)   Social Connection and Isolation Panel    Frequency of Communication with Friends and Family: Once a week    Frequency of Social Gatherings with Friends and Family: Once a week    Attends Religious Services: Never    Database administrator or Organizations: No    Attends Engineer, structural: Never    Marital Status: Divorced    Allergies: No Known Allergies  Current Medications: Current Outpatient Medications  Medication Sig Dispense Refill   [START ON 07/23/2024] amphetamine -dextroamphetamine  (ADDERALL) 30 MG tablet Take 1 tablet by mouth 2 (two) times daily. 60 tablet 0   [START ON 08/23/2024]  amphetamine -dextroamphetamine  (ADDERALL) 30 MG tablet Take 1 tablet by mouth 2 (two) times daily. 60 tablet 0   [START ON 09/22/2024] amphetamine -dextroamphetamine  (ADDERALL) 30 MG tablet Take 1 tablet by mouth 2 (two) times daily. 60 tablet 0   buPROPion  (WELLBUTRIN  XL) 150 MG 24 hr tablet Take 1 tablet (150 mg total) by mouth every morning. 90 tablet 0   ezetimibe  (ZETIA ) 10 MG tablet Take 1 tablet (10 mg total) by mouth daily. 90 tablet 3   levothyroxine  (SYNTHROID ) 150 MCG tablet Take 1 tablet (150 mcg total) by mouth daily. 60 tablet 1   naproxen  (NAPROSYN ) 500 MG tablet Take 1 tablet (500 mg total) by mouth 2 (two) times daily as needed. 60 tablet 0   sildenafil  (VIAGRA ) 100 MG tablet Take 0.5-1 tablets (50-100 mg total) by mouth daily as needed for erectile dysfunction. 10 tablet 2   triamcinolone  ointment (KENALOG ) 0.5 % Apply 1 Application topically 2 (two) times daily. 30 g 0   venlafaxine  XR (EFFEXOR -XR) 37.5 MG 24 hr capsule Take 1 capsule (37.5 mg total) by mouth  daily. 90 capsule 0   No current facility-administered medications for this visit.     Psychiatric Specialty Exam: Review of Systems  There were no vitals taken for this visit.There is no height or weight on file to calculate BMI.  General Appearance: Fairly Groomed  Eye Contact:  Good  Speech:  Clear and Coherent and Normal Rate  Volume:  Normal  Mood:  Euthymic  Affect:  Congruent  Thought Process:  Coherent  Orientation:  Full (Time, Place, and Person)  Thought Content: Logical   Suicidal Thoughts:  No  Homicidal Thoughts:  No  Memory:  NA  Judgement:  Good  Insight:  Good  Psychomotor Activity:  Normal  Concentration:  Concentration: Good  Recall:  Good  Fund of Knowledge: Fair  Language: Good  Akathisia:  NA    AIMS (if indicated): not done  Assets:  Communication Skills Desire for Improvement Housing Physical Health  ADL's:  Intact  Cognition: WNL  Sleep:  Good   Metabolic Disorder Labs: Lab Results  Component Value Date   HGBA1C 5.5 05/20/2024   No results found for: PROLACTIN Lab Results  Component Value Date   CHOL 167 05/20/2024   TRIG 97 05/20/2024   HDL 42 05/20/2024   CHOLHDL 4.0 05/20/2024   LDLCALC 107 (H) 05/20/2024   LDLCALC 105 (H) 01/19/2024   Lab Results  Component Value Date   TSH 1.320 05/20/2024   TSH 0.629 01/19/2024    Therapeutic Level Labs: No results found for: LITHIUM No results found for: VALPROATE No results found for: CBMZ   Screenings: GAD-7    Flowsheet Row Office Visit from 09/18/2023 in [facility] Office Visit from 05/19/2023 in [facility] Office Visit from 09/04/2022 in [facility] Office Visit from [facility]13/20[facility]  Total GAD-7 Score 0 0 0 6   PHQ2-9    Flowsheet Row Office Visit from 03/11/2024 in [facility] Office Visit from 01/19/2024 in [facility] Office Visit from 09/18/2023 in [facility] Clinical Support from 07/09/2023 in [facility] Office Visit from 05/19/2023 in [facility]  PHQ-2 Total Score 0 0 0 0 1  PHQ-9 Total Score -- -- 0 -- 6   Flowsheet Row Office Visit from [facility]/13[facility]2022 [facility] from 03/31/2022 in [facility] Department at [facility]  C-SSRS RISK CATEGORY No Risk No Risk    Collaboration of Care: Collaboration of Care: Medication Management AEB medication prescription and Primary Care Provider AEB chart review  Patient/Guardian was advised Release of Information must be obtained prior to any record release in order to collaborate their care with an outside provider. Patient/Guardian was advised if they have not already done so to contact the registration department to sign all necessary forms in order for us  to release information regarding their care.   Consent: Patient/Guardian gives verbal consent for treatment and assignment of benefits for services provided during this visit. Patient/Guardian expressed understanding and agreed to proceed.    Arvella CHRISTELLA Finder, MD 07/02/2024, 11:31 AM   Virtual Visit via Video Note  I connected with Troy Allen on 07/02/24 at 11:00 AM EDT by a video enabled telemedicine application and verified that I am speaking with the correct person using two identifiers.  Location: Patient: Home  Provider: Home Office   I discussed the limitations of evaluation and management by telemedicine and the availability of in person appointments. The patient expressed understanding and agreed to proceed.   I discussed the assessment and treatment plan with the patient. The patient was provided an opportunity to ask questions and all were answered. The patient agreed with the plan and demonstrated an understanding of the instructions.   The  patient was advised to call back or seek an in-person evaluation if the symptoms worsen or if the condition fails to improve as anticipated.  I provided 25 minutes of non-face-to-face time during this encounter.   Arvella CHRISTELLA Finder, MD

## 2024-07-02 ENCOUNTER — Telehealth (HOSPITAL_COMMUNITY): Admitting: Psychiatry

## 2024-07-02 ENCOUNTER — Encounter (HOSPITAL_COMMUNITY): Payer: Self-pay | Admitting: Psychiatry

## 2024-07-02 DIAGNOSIS — F321 Major depressive disorder, single episode, moderate: Secondary | ICD-10-CM

## 2024-07-02 DIAGNOSIS — F902 Attention-deficit hyperactivity disorder, combined type: Secondary | ICD-10-CM

## 2024-07-02 MED ORDER — AMPHETAMINE-DEXTROAMPHETAMINE 30 MG PO TABS: 30.0000 mg | ORAL_TABLET | Freq: Two times a day (BID) | ORAL | 0 refills | Status: DC

## 2024-07-02 MED ORDER — VENLAFAXINE HCL ER 37.5 MG PO CP24: 37.5000 mg | ORAL_CAPSULE | Freq: Every day | ORAL | 0 refills | Status: DC

## 2024-07-02 MED ORDER — BUPROPION HCL ER (XL) 150 MG PO TB24: 150.0000 mg | ORAL_TABLET | ORAL | 0 refills | Status: DC

## 2024-07-11 ENCOUNTER — Other Ambulatory Visit (HOSPITAL_COMMUNITY): Payer: Self-pay | Admitting: Psychiatry

## 2024-07-11 DIAGNOSIS — F321 Major depressive disorder, single episode, moderate: Secondary | ICD-10-CM

## 2024-07-12 ENCOUNTER — Other Ambulatory Visit: Payer: Self-pay | Admitting: Family Medicine

## 2024-07-12 DIAGNOSIS — E063 Autoimmune thyroiditis: Secondary | ICD-10-CM

## 2024-08-26 ENCOUNTER — Other Ambulatory Visit: Payer: Self-pay | Admitting: Family Medicine

## 2024-08-26 DIAGNOSIS — N528 Other male erectile dysfunction: Secondary | ICD-10-CM

## 2024-08-26 NOTE — Telephone Encounter (Unsigned)
 Copied from CRM [id number]. Topic: Clinical - Medication Refill >> Aug 26, 2024  4:08 PM Antwanette L wrote: Medication: sildenafil  (VIAGRA ) 100 MG tablet  Has the patient contacted their pharmacy? Yes   This is the patient's preferred pharmacy:   [facility] [phone] - [place], [place] - [address] AT [facility] [address] [place] [address] Phone: [phone] Fax: [phone]    Is this the correct pharmacy for this prescription? Yes   Has the prescription been filled recently? Yes. Last refill was on 01/19/24  Is the patient out of the medication? Yes  Has the patient been seen for an appointment in the last year OR does the patient have an upcoming appointment? Yes. Last ov with Meade Gerlach FNP was on 05/20/24 and next appt is 09/20/24  Can we respond through MyChart? Yes  Agent: Please be advised that Rx refills may take up to 3 business days. We ask that you follow-up with your pharmacy.

## 2024-08-27 MED ORDER — SILDENAFIL CITRATE 100 MG PO TABS: 50.0000 mg | ORAL_TABLET | Freq: Every day | ORAL | 2 refills | Status: DC | PRN

## 2024-09-12 ENCOUNTER — Other Ambulatory Visit: Payer: Self-pay | Admitting: Family Medicine

## 2024-09-12 DIAGNOSIS — E063 Autoimmune thyroiditis: Secondary | ICD-10-CM

## 2024-09-14 ENCOUNTER — Encounter (HOSPITAL_COMMUNITY): Payer: Self-pay

## 2024-09-15 ENCOUNTER — Telehealth (HOSPITAL_COMMUNITY): Payer: Self-pay | Admitting: *Deleted

## 2024-09-15 DIAGNOSIS — F902 Attention-deficit hyperactivity disorder, combined type: Secondary | ICD-10-CM

## 2024-09-15 MED ORDER — AMPHETAMINE-DEXTROAMPHETAMINE 15 MG PO TABS: ORAL_TABLET | ORAL | 0 refills | Status: AC

## 2024-09-15 NOTE — Telephone Encounter (Signed)
Send new prescription to pharmacy.

## 2024-09-15 NOTE — Telephone Encounter (Signed)
 Pt last fill of the Adderall IR 30 mg BID was last filled 08/07/24. Pharmacy confirms this. So due to shortage of the 30 mg he is requesting 2,15 mgs BID as pharmacy does have the 15 mg available currently. Please review.

## 2024-09-15 NOTE — Telephone Encounter (Signed)
 His prescriptions written on July 23, 2010 10th and December 10.  He is not due to get fill his prescription until December 10 and he can wait for Dr. Arvella to make decision if he wants to send a new prescription.

## 2024-09-20 ENCOUNTER — Ambulatory Visit: Admitting: Family Medicine

## 2024-09-20 ENCOUNTER — Encounter: Payer: Self-pay | Admitting: Family Medicine

## 2024-09-20 VITALS — BP 136/76 | HR 89 | Ht 68.0 in | Wt 161.0 lb

## 2024-09-20 DIAGNOSIS — R7301 Impaired fasting glucose: Secondary | ICD-10-CM

## 2024-09-20 DIAGNOSIS — E785 Hyperlipidemia, unspecified: Secondary | ICD-10-CM

## 2024-09-20 DIAGNOSIS — E063 Autoimmune thyroiditis: Secondary | ICD-10-CM | POA: Diagnosis not present

## 2024-09-20 DIAGNOSIS — E038 Other specified hypothyroidism: Secondary | ICD-10-CM

## 2024-09-20 DIAGNOSIS — E559 Vitamin D deficiency, unspecified: Secondary | ICD-10-CM

## 2024-09-20 NOTE — Progress Notes (Unsigned)
 BH MD/PA/NP OP Progress Note  09/20/2024 8:19 AM Troy Allen  MRN:  [id number]  Visit Diagnosis:  No diagnosis found.  Assessment:  Troy Allen is a 68 y.o. male with a history of depression, hashimoto's disease, and reported ADD who presented to [facility] at [facility] for initial evaluation on 08/13/2022.  During initial evaluation patient reported a history of depression and ADHD though the depression has been well controlled.  He reported experiencing symptoms of fatigue, low mood, amotivation, and anhedonia during periods of depression.  He had 1 instance of passive SI with no plan or intent over 20 years ago.  He denied any SI/HI or thoughts of self-harm at this time along with any history of mania, psychosis, paranoia, or delusions.  Patient also endorsed symptoms of poor concentration, increased sluggishness, fatigue, and impaired memory.  Patient felt these symptoms improved when he had taken Adderall in the past.  Of note patient has a past history of Hashimoto's disease which he is on Synthroid  and TSH is currently within normal limits.  Patient met criteria for MDD currently stable and neuropsych testing was positive for ADHD.    Troy Allen presents for follow-up evaluation. Today, 09/20/24, patient    reports that mood and ADHD symptoms have been stable with consistent medication compliance.  He denies any adverse medication side effects. We will continue on current regimen and follow-up in 3 months.  Plan: - Continue Venlafaxine  37.5 mg QD - Continue Bupropion  XL 150 mg daily   - Continue Adderall 30 mg BID - CMP, CBC, lipid profile, vitamin D , TSH, and free T4 reviewed - Neuropsych testing completed and reviewed, patient to send in a copy - Sleep specialist referral for sleep apnea - Crisis resources reviewed - Therapy referral - Insurance letter completed - Follow up in 3 months  Chief Complaint:  No chief complaint on  file.  HPI: Troy Allen presents reporting that    things have been good over the last 3 months.  He denies any changes of note other than update to his pharmacy.  Looking forward he is going to be visiting his grandson in [place] for [place].  His middle son Troy Allen's birthday and grandson's father is around that time as well.  We discussed a bit about his middle son and the divorce he went through 6 months ago.  Looking back to Troy Allen has a hard time not comparing his son situation to his own.  He feels tha his son and son's ex-wife handled the divorce much better than Troy Allen and his ex-wife did particularly in relationship to the kids.  Patient still endorses some regrets about his actions back then and the negative impact it had on his kids/their relationship.  Things are better with his sons now however it took some time for the relationship to be repaired.  Patient has some thoughts about talking with his son about his own experience particularly when looking for a new partner postdivorce given that he has been through a lot in that field before finding was right for him.  Mood wise he has been stable without any concerns. His attention and concentration have been well controlled on the medicine. He has not experienced any adverse side effects.   Past Psychiatric History: Patient's psychiatric care started in 1995 first with therapy than medication.  He reports trying a number of medications but only remembers Prozac, bupropion , and Effexor  in the antidepressant realm.  He has also only tried Adderall  as far stimulants ago.  He denies any prior suicide attempts or psychiatric hospitalizations.  Bupropion  XL 150 mg started on 08/13/22 and increased to 300 mg on 10/21/2022  Patient reports having half a glass of wine with dinner and using pot occasionally around once or twice a week.  He notes that he does smoke nicotine but wants to quit.    Past Medical History:  Past Medical History:   Diagnosis Date   Hashimoto's disease     Past Surgical History:  Procedure Laterality Date   HAND TENDON SURGERY Left     Family Psychiatric History: His brother has depression and committed suicide  Family History: No family history on file.  Social History:  Social History   Socioeconomic History   Marital status: Divorced    Spouse name: Not on file   Number of children: Not on file   Years of education: Not on file   Highest education level: Not on file  Occupational History   Not on file  Tobacco Use   Smoking status: Never    Passive exposure: Never   Smokeless tobacco: Never  Vaping Use   Vaping status: Former  Substance and Sexual Activity   Alcohol use: Yes    Comment: occasionally   Drug use: Yes    Types: Marijuana   Sexual activity: Yes  Other Topics Concern   Not on file  Social History Narrative   Not on file   Social Drivers of Health   Financial Resource Strain: Low Risk  (07/09/2023)   Overall Financial Resource Strain (CARDIA)    Difficulty of Paying Living Expenses: Not hard at all  Food Insecurity: No Food Insecurity (07/09/2023)   Hunger Vital Sign    Worried About Running Out of Food in the Last Year: Never true    Ran Out of Food in the Last Year: Never true  Transportation Needs: No Transportation Needs (07/09/2023)   PRAPARE - Administrator, Civil Service (Medical): No    Lack of Transportation (Non-Medical): No  Physical Activity: Sufficiently Active (07/09/2023)   Exercise Vital Sign    Days of Exercise per Week: 7 days    Minutes of Exercise per Session: 30 min  Stress: No Stress Concern Present (07/09/2023)   [place] Allen of Occupational Health - Occupational Stress Questionnaire    Feeling of Stress : Not at all  Social Connections: Socially Isolated (07/09/2023)   Social Connection and Isolation Panel    Frequency of Communication with Friends and Family: Once a week    Frequency of Social Gatherings with  Friends and Family: Once a week    Attends Religious Services: Never    Database Administrator or Organizations: No    Attends Engineer, Structural: Never    Marital Status: Divorced    Allergies: No Known Allergies  Current Medications: Current Outpatient Medications  Medication Sig Dispense Refill   amphetamine -dextroamphetamine  (ADDERALL) 15 MG tablet Take two tab twice daily. Check PDMP before dispense. 120 tablet 0   buPROPion  (WELLBUTRIN  XL) 150 MG 24 hr tablet Take 1 tablet (150 mg total) by mouth every morning. 90 tablet 0   ezetimibe  (ZETIA ) 10 MG tablet Take 1 tablet (10 mg total) by mouth daily. 90 tablet 3   levothyroxine  (SYNTHROID ) 150 MCG tablet Take 1 tablet by mouth once daily 60 tablet 0   naproxen  (NAPROSYN ) 500 MG tablet Take 1 tablet (500 mg total) by mouth 2 (two) times daily as needed.  60 tablet 0   sildenafil  (VIAGRA ) 100 MG tablet Take 0.5-1 tablets (50-100 mg total) by mouth daily as needed for erectile dysfunction. 10 tablet 2   triamcinolone  ointment (KENALOG ) 0.5 % Apply 1 Application topically 2 (two) times daily. 30 g 0   venlafaxine  XR (EFFEXOR -XR) 37.5 MG 24 hr capsule Take 1 capsule (37.5 mg total) by mouth daily. 90 capsule 0   No current facility-administered medications for this visit.     Psychiatric Specialty Exam: Review of Systems  There were no vitals taken for this visit.There is no height or weight on file to calculate BMI.  General Appearance: Fairly Groomed  Eye Contact:  Good  Speech:  Clear and Coherent and Normal Rate  Volume:  Normal  Mood:  Euthymic  Affect:  Congruent  Thought Process:  Coherent  Orientation:  Full (Time, Place, and Person)  Thought Content: Logical   Suicidal Thoughts:  No  Homicidal Thoughts:  No  Memory:  NA  Judgement:  Good  Insight:  Good  Psychomotor Activity:  Normal  Concentration:  Concentration: Good  Recall:  Good  Fund of Knowledge: Fair  Language: Good  Akathisia:  NA    AIMS  (if indicated): not done  Assets:  Communication Skills Desire for Improvement Housing Physical Health  ADL's:  Intact  Cognition: WNL  Sleep:  Good   Metabolic Disorder Labs: Lab Results  Component Value Date   HGBA1C 5.5 05/20/2024   No results found for: PROLACTIN Lab Results  Component Value Date   CHOL 167 05/20/2024   TRIG 97 05/20/2024   HDL 42 05/20/2024   CHOLHDL 4.0 05/20/2024   LDLCALC 107 (H) 05/20/2024   LDLCALC 105 (H) 01/19/2024   Lab Results  Component Value Date   TSH 1.320 05/20/2024   TSH 0.629 01/19/2024    Therapeutic Level Labs: No results found for: LITHIUM No results found for: VALPROATE No results found for: CBMZ   Screenings: GAD-7    Flowsheet Row Office Visit from 09/18/2023 in [facility] Primary [facility] Office Visit from 05/19/2023 in [facility] Primary Care Office Visit from 09/04/2022 in [facility] Primary Care Office Visit from [facility]13/2022 [facility]  Total GAD-7 Score 0 0 0 6   PHQ2-9    Flowsheet Row Office Visit from 03/11/2024 in [facility] Office Visit from 01/19/2024 in [facility] Primary Care Office Visit from 09/18/2023 in [facility] Primary Care Clinical Support from 07/09/2023 in [facility] Primary Care Office Visit from 05/19/2023 in [facility]  PHQ-2 Total Score 0 0 0 0 1  PHQ-9 Total Score -- -- 0 -- 6   Flowsheet Row Office Visit from 08/13/2022 in [facility] ED from 03/31/2022 in [facility] Emergency Department at [facility]  C-SSRS RISK CATEGORY No Risk No Risk    Collaboration of Care: Collaboration of Care: Medication Management AEB medication prescription and Primary Care Provider AEB chart review  Patient/Guardian was advised Release of Information must be obtained prior to any record release in order to  collaborate their care with an outside provider. Patient/Guardian was advised if they have not already done so to contact the registration department to sign all necessary forms in order for us  to release information regarding their care.   Consent: Patient/Guardian gives verbal consent for treatment and assignment of benefits for services provided during this visit. Patient/Guardian expressed understanding and  agreed to proceed.    Arvella CHRISTELLA Finder, MD 09/20/2024, 8:19 AM   Virtual Visit via Video Note  I connected with Troy Allen on 09/20/24 at 10:30 AM EST by a video enabled telemedicine application and verified that I am speaking with the correct person using two identifiers.  Location: Patient: Home  Provider: Home Office   I discussed the limitations of evaluation and management by telemedicine and the availability of in person appointments. The patient expressed understanding and agreed to proceed.   I discussed the assessment and treatment plan with the patient. The patient was provided an opportunity to ask questions and all were answered. The patient agreed with the plan and demonstrated an understanding of the instructions.   The patient was advised to call back or seek an in-person evaluation if the symptoms worsen or if the condition fails to improve as anticipated.  I provided 25 minutes of non-face-to-face time during this encounter.   Arvella CHRISTELLA Finder, MD

## 2024-09-20 NOTE — Progress Notes (Signed)
 Established Patient Office Visit  Subjective:  Patient ID: Troy Allen, male    DOB: 08-May-1956  Age: 68 y.o. MRN: [id number]  CC:  Chief Complaint  Patient presents with   Medical Management of Chronic Issues    Four month follow up     HPI Troy Allen is a 68 y.o. male with past medical history of Hashimoto's disease, ADD, depression  hyperlipidemia presents for f/u of  chronic medical conditions.  For the details of today's visit, please refer to the assessment and plan.     Past Medical History:  Diagnosis Date   Hashimoto's disease     Past Surgical History:  Procedure Laterality Date   HAND TENDON SURGERY Left     No family history on file.  Social History   Socioeconomic History   Marital status: Divorced    Spouse name: Not on file   Number of children: Not on file   Years of education: Not on file   Highest education level: Not on file  Occupational History   Not on file  Tobacco Use   Smoking status: Never    Passive exposure: Never   Smokeless tobacco: Never  Vaping Use   Vaping status: Former  Substance and Sexual Activity   Alcohol use: Yes    Comment: occasionally   Drug use: Yes    Types: Marijuana   Sexual activity: Yes  Other Topics Concern   Not on file  Social History Narrative   Not on file   Social Drivers of Health   Financial Resource Strain: Low Risk  (07/09/2023)   Overall Financial Resource Strain (CARDIA)    Difficulty of Paying Living Expenses: Not hard at all  Food Insecurity: No Food Insecurity (07/09/2023)   Hunger Vital Sign    Worried About Running Out of Food in the Last Year: Never true    Ran Out of Food in the Last Year: Never true  Transportation Needs: No Transportation Needs (07/09/2023)   PRAPARE - Administrator, Civil Service (Medical): No    Lack of Transportation (Non-Medical): No  Physical Activity: Sufficiently Active (07/09/2023)   Exercise Vital Sign    Days of Exercise per  Week: 7 days    Minutes of Exercise per Session: 30 min  Stress: No Stress Concern Present (07/09/2023)   [employer] of Occupational Health - Occupational Stress Questionnaire    Feeling of Stress : Not at all  Social Connections: Socially Isolated (07/09/2023)   Social Connection and Isolation Panel    Frequency of Communication with Friends and Family: Once a week    Frequency of Social Gatherings with Friends and Family: Once a week    Attends Religious Services: Never    Database Administrator or Organizations: No    Attends Banker Meetings: Never    Marital Status: Divorced  Catering Manager Violence: Not At Risk (07/09/2023)   Humiliation, Afraid, Rape, and Kick questionnaire    Fear of Current or Ex-Partner: No    Emotionally Abused: No    Physically Abused: No    Sexually Abused: No    Outpatient Medications Prior to Visit  Medication Sig Dispense Refill   amphetamine -dextroamphetamine  (ADDERALL) 15 MG tablet Take two tab twice daily. Check PDMP before dispense. 120 tablet 0   buPROPion  (WELLBUTRIN  XL) 150 MG 24 hr tablet Take 1 tablet (150 mg total) by mouth every morning. 90 tablet 0   ezetimibe  (ZETIA ) 10 MG tablet  Take 1 tablet (10 mg total) by mouth daily. 90 tablet 3   levothyroxine  (SYNTHROID ) 150 MCG tablet Take 1 tablet by mouth once daily 60 tablet 0   naproxen  (NAPROSYN ) 500 MG tablet Take 1 tablet (500 mg total) by mouth 2 (two) times daily as needed. 60 tablet 0   sildenafil  (VIAGRA ) 100 MG tablet Take 0.5-1 tablets (50-100 mg total) by mouth daily as needed for erectile dysfunction. 10 tablet 2   triamcinolone  ointment (KENALOG ) 0.5 % Apply 1 Application topically 2 (two) times daily. 30 g 0   venlafaxine  XR (EFFEXOR -XR) 37.5 MG 24 hr capsule Take 1 capsule (37.5 mg total) by mouth daily. 90 capsule 0   No facility-administered medications prior to visit.    No Known Allergies  ROS Review of Systems  Constitutional:  Negative for fatigue  and fever.  Eyes:  Negative for visual disturbance.  Respiratory:  Negative for chest tightness and shortness of breath.   Cardiovascular:  Negative for chest pain and palpitations.  Neurological:  Negative for dizziness and headaches.      Objective:    Physical Exam HENT:     Head: Normocephalic.     Right Ear: External ear normal.     Left Ear: External ear normal.     Nose: No congestion or rhinorrhea.     Mouth/Throat:     Mouth: Mucous membranes are moist.  Cardiovascular:     Rate and Rhythm: Regular rhythm.     Heart sounds: No murmur heard. Pulmonary:     Effort: No respiratory distress.     Breath sounds: Normal breath sounds.  Neurological:     Mental Status: He is alert.     BP 136/76   Pulse 89   Ht 5' 8 (1.727 m)   Wt 161 lb (73 kg)   SpO2 97%   BMI 24.48 kg/m  Wt Readings from Last 3 Encounters:  09/20/24 161 lb (73 kg)  05/20/24 164 lb 0.6 oz (74.4 kg)  03/11/24 153 lb 6.4 oz (69.6 kg)    Lab Results  Component Value Date   TSH 1.320 05/20/2024   Lab Results  Component Value Date   WBC 7.3 05/20/2024   HGB 14.0 05/20/2024   HCT 41.9 05/20/2024   MCV 101 (H) 05/20/2024   PLT 312 05/20/2024   Lab Results  Component Value Date   NA 138 05/20/2024   K 5.4 (H) 05/20/2024   CO2 23 05/20/2024   GLUCOSE 89 05/20/2024   BUN 14 05/20/2024   CREATININE 0.89 05/20/2024   BILITOT 0.3 05/20/2024   ALKPHOS 68 05/20/2024   AST 22 05/20/2024   ALT 19 05/20/2024   PROT 6.7 05/20/2024   ALBUMIN 4.3 05/20/2024   CALCIUM  9.6 05/20/2024   EGFR 93 05/20/2024   Lab Results  Component Value Date   CHOL 167 05/20/2024   Lab Results  Component Value Date   HDL 42 05/20/2024   Lab Results  Component Value Date   LDLCALC 107 (H) 05/20/2024   Lab Results  Component Value Date   TRIG 97 05/20/2024   Lab Results  Component Value Date   CHOLHDL 4.0 05/20/2024   Lab Results  Component Value Date   HGBA1C 5.5 05/20/2024      Assessment &  Plan:  Hashimoto's disease Assessment & Plan: Will send refills after lab results. Encouraged to continue taking Synthroid  150 mcg daily.    Hyperlipidemia LDL goal <100 Assessment & Plan: Continue treatment regimen as  is  Orders: -     Lipid panel -     CMP14+EGFR -     CBC with Differential/Platelet  IFG (impaired fasting glucose) -     Hemoglobin A1c  Vitamin D  deficiency -     VITAMIN D  25 Hydroxy (Vit-D Deficiency, Fractures)  TSH (thyroid -stimulating hormone deficiency) -     TSH + free T4  Note: This chart has been completed using Engineer, Civil (consulting) software, and while attempts have been made to ensure accuracy, certain words and phrases may not be transcribed as intended.    Follow-up: Return in about 5 months (around 02/18/2025).   Lyan Holck  Z Bacchus, FNP

## 2024-09-20 NOTE — Patient Instructions (Addendum)
 I appreciate the opportunity to provide care to you today!    Follow up:  5 months  Labs: please stop by the lab during the week to get your blood drawn (CBC, CMP, TSH, Lipid profile, HgA1c, Vit D)  For a Healthier YOU, I Recommend: Reducing your intake of sugar, sodium, carbohydrates, and saturated fats. Increasing your fiber intake by incorporating more whole grains, fruits, and vegetables into your meals. Setting healthy goals with a focus on lowering your consumption of carbs, sugar, and unhealthy fats. Adding variety to your diet by including a wide range of fruits and vegetables. Cutting back on soda and limiting processed foods as much as possible. Staying active: Aim for at least 150 minutes of moderate-intensity physical activity each week for optimal results.    Please follow up if your symptoms worsen or fail to improve.    Please continue to a heart-healthy diet and increase your physical activities. Try to exercise for at least five days a week.    It was a pleasure to see you and I look forward to continuing to work together on your health and well-being. Please do not hesitate to call the office if you need care or have questions about your care.  In case of emergency, please visit the Emergency Department for urgent care, or contact our clinic at [phone] to schedule an appointment. We're here to help you!   Have a wonderful day and week. With Gratitude, Meade JENEANE Gerlach MSN, FNP-BC, PMHNP-BC

## 2024-09-20 NOTE — Assessment & Plan Note (Signed)
 Will send refills after lab results. Encouraged to continue taking Synthroid  150 mcg daily.

## 2024-09-20 NOTE — Assessment & Plan Note (Signed)
 Continue treatment regimen as is

## 2024-09-23 DIAGNOSIS — E038 Other specified hypothyroidism: Secondary | ICD-10-CM | POA: Diagnosis not present

## 2024-09-23 DIAGNOSIS — E782 Mixed hyperlipidemia: Secondary | ICD-10-CM | POA: Diagnosis not present

## 2024-09-23 DIAGNOSIS — R7301 Impaired fasting glucose: Secondary | ICD-10-CM | POA: Diagnosis not present

## 2024-09-23 DIAGNOSIS — E559 Vitamin D deficiency, unspecified: Secondary | ICD-10-CM | POA: Diagnosis not present

## 2024-09-24 ENCOUNTER — Other Ambulatory Visit: Payer: Self-pay | Admitting: Family Medicine

## 2024-09-24 ENCOUNTER — Encounter (HOSPITAL_COMMUNITY): Payer: Self-pay | Admitting: Psychiatry

## 2024-09-24 ENCOUNTER — Telehealth (HOSPITAL_COMMUNITY): Admitting: Psychiatry

## 2024-09-24 DIAGNOSIS — E063 Autoimmune thyroiditis: Secondary | ICD-10-CM

## 2024-09-24 DIAGNOSIS — F321 Major depressive disorder, single episode, moderate: Secondary | ICD-10-CM

## 2024-09-24 DIAGNOSIS — F902 Attention-deficit hyperactivity disorder, combined type: Secondary | ICD-10-CM

## 2024-09-24 LAB — CBC WITH DIFFERENTIAL/PLATELET
Basophils Absolute: 0.1 x10E3/uL (ref 0.0–0.2)
Basos: 1 %
EOS (ABSOLUTE): 0.2 x10E3/uL (ref 0.0–0.4)
Eos: 3 %
Hematocrit: 40.5 % (ref 37.5–51.0)
Hemoglobin: 13.3 g/dL (ref 13.0–17.7)
Immature Grans (Abs): 0 x10E3/uL (ref 0.0–0.1)
Immature Granulocytes: 0 %
Lymphocytes Absolute: 2.3 x10E3/uL (ref 0.7–3.1)
Lymphs: 33 %
MCH: 33.8 pg — ABNORMAL HIGH (ref 26.6–33.0)
MCHC: 32.8 g/dL (ref 31.5–35.7)
MCV: 103 fL — ABNORMAL HIGH (ref 79–97)
Monocytes Absolute: 0.7 x10E3/uL (ref 0.1–0.9)
Monocytes: 10 %
Neutrophils Absolute: 3.8 x10E3/uL (ref 1.4–7.0)
Neutrophils: 53 %
Platelets: 301 x10E3/uL (ref 150–450)
RBC: 3.94 x10E6/uL — ABNORMAL LOW (ref 4.14–5.80)
RDW: 12.3 % (ref 11.6–15.4)
WBC: 7.1 x10E3/uL (ref 3.4–10.8)

## 2024-09-24 LAB — HEMOGLOBIN A1C
Est. average glucose Bld gHb Est-mCnc: 117 mg/dL
Hgb A1c MFr Bld: 5.7 % — ABNORMAL HIGH (ref 4.8–5.6)

## 2024-09-24 LAB — TSH+FREE T4
Free T4: 1.08 ng/dL (ref 0.82–1.77)
TSH: 1.36 u[IU]/mL (ref 0.450–4.500)

## 2024-09-24 LAB — CMP14+EGFR
ALT: 16 IU/L (ref 0–44)
AST: 16 IU/L (ref 0–40)
Albumin: 3.8 g/dL — ABNORMAL LOW (ref 3.9–4.9)
Alkaline Phosphatase: 66 IU/L (ref 47–123)
BUN/Creatinine Ratio: 17 (ref 10–24)
BUN: 15 mg/dL (ref 8–27)
Bilirubin Total: 0.3 mg/dL (ref 0.0–1.2)
CO2: 25 mmol/L (ref 20–29)
Calcium: 9.2 mg/dL (ref 8.6–10.2)
Chloride: 102 mmol/L (ref 96–106)
Creatinine, Ser: 0.87 mg/dL (ref 0.76–1.27)
Globulin, Total: 2.4 g/dL (ref 1.5–4.5)
Glucose: 92 mg/dL (ref 70–99)
Potassium: 4.7 mmol/L (ref 3.5–5.2)
Sodium: 141 mmol/L (ref 134–144)
Total Protein: 6.2 g/dL (ref 6.0–8.5)
eGFR: 93 mL/min/1.73 (ref >59)

## 2024-09-24 LAB — VITAMIN D 25 HYDROXY (VIT D DEFICIENCY, FRACTURES): Vit D, 25-Hydroxy: 32.8 ng/mL (ref 30.0–100.0)

## 2024-09-24 LAB — LIPID PANEL
Chol/HDL Ratio: 4.1 ratio (ref 0.0–5.0)
Cholesterol, Total: 163 mg/dL (ref 100–199)
HDL: 40 mg/dL (ref >39)
LDL Chol Calc (NIH): 106 mg/dL — ABNORMAL HIGH (ref 0–99)
Triglycerides: 88 mg/dL (ref 0–149)
VLDL Cholesterol Cal: 17 mg/dL (ref 5–40)

## 2024-09-24 MED ORDER — AMPHETAMINE-DEXTROAMPHET ER 30 MG PO CP24: 30.0000 mg | ORAL_CAPSULE | Freq: Every day | ORAL | 0 refills | Status: AC

## 2024-09-24 MED ORDER — BUPROPION HCL ER (XL) 150 MG PO TB24: 150.0000 mg | ORAL_TABLET | ORAL | 0 refills | Status: AC

## 2024-09-24 MED ORDER — LEVOTHYROXINE SODIUM 150 MCG PO TABS: 150.0000 ug | ORAL_TABLET | Freq: Every day | ORAL | 1 refills | Status: AC

## 2024-09-24 MED ORDER — VENLAFAXINE HCL ER 37.5 MG PO CP24: 37.5000 mg | ORAL_CAPSULE | Freq: Every day | ORAL | 0 refills | Status: AC

## 2024-09-25 ENCOUNTER — Other Ambulatory Visit: Payer: Self-pay | Admitting: Family Medicine

## 2024-09-25 DIAGNOSIS — E785 Hyperlipidemia, unspecified: Secondary | ICD-10-CM

## 2024-09-27 ENCOUNTER — Other Ambulatory Visit (HOSPITAL_COMMUNITY): Payer: Self-pay | Admitting: Psychiatry

## 2024-09-27 DIAGNOSIS — F321 Major depressive disorder, single episode, moderate: Secondary | ICD-10-CM

## 2024-09-30 ENCOUNTER — Ambulatory Visit: Payer: Self-pay | Admitting: Family Medicine

## 2024-10-13 ENCOUNTER — Other Ambulatory Visit: Payer: Self-pay | Admitting: Family Medicine

## 2024-10-13 DIAGNOSIS — N528 Other male erectile dysfunction: Secondary | ICD-10-CM

## 2024-10-13 NOTE — Telephone Encounter (Unsigned)
 Copied from CRM [id number]. Topic: Clinical - Medication Refill >> Oct 13, 2024  3:36 PM Lauren C wrote: Medication: sildenafil  (VIAGRA ) 100 MG tablet  Has the patient contacted their pharmacy? Yes No refills   This is the patient's preferred pharmacy:  [facility] [place], [place] - [id number] [place] [address] Phone: [phone] Fax: [phone]  Is this the correct pharmacy for this prescription? Yes If no, delete pharmacy and type the correct one.   Has the prescription been filled recently? Yes  Is the patient out of the medication? Yes  Has the patient been seen for an appointment in the last year OR does the patient have an upcoming appointment? Yes  Can we respond through MyChart? Yes  Agent: Please be advised that Rx refills may take up to 3 business days. We ask that you follow-up with your pharmacy.

## 2024-10-15 MED ORDER — SILDENAFIL CITRATE 100 MG PO TABS: 50.0000 mg | ORAL_TABLET | Freq: Every day | ORAL | 2 refills | Status: AC | PRN

## 2024-10-20 ENCOUNTER — Other Ambulatory Visit: Payer: Self-pay

## 2024-10-20 ENCOUNTER — Ambulatory Visit
Admission: EM | Admit: 2024-10-20 | Discharge: 2024-10-20 | Disposition: A | Discharge status: Home / Self Care | Attending: Nurse Practitioner | Admitting: Nurse Practitioner

## 2024-10-20 ENCOUNTER — Telehealth: Admitting: Physician Assistant

## 2024-10-20 ENCOUNTER — Encounter: Payer: Self-pay | Admitting: Emergency Medicine

## 2024-10-20 DIAGNOSIS — N12 Tubulo-interstitial nephritis, not specified as acute or chronic: Secondary | ICD-10-CM | POA: Diagnosis present

## 2024-10-20 DIAGNOSIS — R300 Dysuria: Secondary | ICD-10-CM

## 2024-10-20 LAB — POCT URINE DIPSTICK
Bilirubin, UA: NEGATIVE
Glucose, UA: NEGATIVE mg/dL
Nitrite, UA: POSITIVE — AB
POC PROTEIN,UA: 100 — AB
Spec Grav, UA: 1.02
Urobilinogen, UA: 4 U/dL — AB
pH, UA: 6.5

## 2024-10-20 MED ORDER — CEFTRIAXONE SODIUM 1 G IJ SOLR
1.0000 g | Freq: Once | INTRAMUSCULAR | Status: AC
  Administered 2024-10-20: 1 g via INTRAMUSCULAR

## 2024-10-20 MED ORDER — LEVOFLOXACIN 750 MG PO TABS: 750.0000 mg | ORAL_TABLET | Freq: Every day | ORAL | 0 refills | Status: AC

## 2024-10-20 MED ORDER — SODIUM CHLORIDE 0.9 % IV BOLUS
1000.0000 mL | Freq: Once | INTRAVENOUS | Status: AC
  Administered 2024-10-20: 1000 mL via INTRAVENOUS

## 2024-10-20 NOTE — Progress Notes (Signed)
 E-Visit for Urinary Problems  Based on what you shared with me, I feel your condition warrants further evaluation and I recommend that you be seen for a face to face office visit.  Male bladder infections are not very common.  We worry about prostate or kidney conditions.  The standard of care is to examine the abdomen and kidneys, and to do a urine and blood test to make sure that something more serious is not going on.  We recommend that you see a provider today.  If your doctor's office is closed [name] has the following Urgent Cares:   NOTE: There will be NO CHARGE for this E-Visit   If you are having a true medical emergency, please call 911.     For an urgent face to face visit, [facility] has multiple urgent care centers for your convenience.  Click the link below for the full list of locations and hours, walk-in wait times, appointment scheduling options and driving directions:  Urgent Care - [place], [place], [place], [place], [place], [place]       Your MyChart E-visit questionnaire answers were reviewed by a board certified advanced clinical practitioner to complete your personal care plan based on your specific symptoms.  Thank you for using e-Visits.

## 2024-10-20 NOTE — ED Provider Notes (Signed)
 " [facility]    CSN: [id number] Arrival date & time: 10/20/24  1354      History   Chief Complaint Chief Complaint  Patient presents with   Back Pain    HPI SERGEI DELO is a 69 y.o. male.   The history is provided by the patient.   Patient presents with a 3-day history of pain with urination, urinary frequency, decreased urine output, fatigue, generalized weakness, and low-grade temperature.  He also reports bilateral back pain around his kidneys that is worse with lying down.  He denies fever, chills, chest pain, abdominal pain, nausea, vomiting, diarrhea, or rash.  Patient denies prior history of urinary tract infections, kidney stones, or kidney infections.  States that he did take a home COVID and flu test which was negative.  States he attempted to complete an e-visit earlier for his symptoms but were referred for an in person visit for further evaluation.  Past Medical History:  Diagnosis Date   Hashimoto's disease     Patient Active Problem List   Diagnosis Date Noted   Tendonitis of elbow, right 03/15/2024   Visual disturbance 01/19/2024   Hyperlipidemia LDL goal <100 01/10/2023   Erectile dysfunction 09/04/2022   Need for immunization against influenza 09/04/2022   Depression 06/05/2022   Hashimoto's disease 02/27/2022   ADD (attention deficit disorder) 02/27/2022    Past Surgical History:  Procedure Laterality Date   HAND TENDON SURGERY Left        Home Medications    Prior to Admission medications  Medication Sig Start Date End Date Taking? Authorizing Provider  amphetamine -dextroamphetamine  (ADDERALL XR) 30 MG 24 hr capsule Take 1 capsule (30 mg total) by mouth daily. 10/13/24   Carvin Arvella HERO, MD  amphetamine -dextroamphetamine  (ADDERALL XR) 30 MG 24 hr capsule Take 1 capsule (30 mg total) by mouth daily. 11/12/24   Carvin Arvella HERO, MD  amphetamine -dextroamphetamine  (ADDERALL XR) 30 MG 24 hr capsule Take 1 capsule (30 mg total) by  mouth daily. 12/12/24   Carvin Arvella HERO, MD  amphetamine -dextroamphetamine  (ADDERALL) 15 MG tablet Take two tab twice daily. Check PDMP before dispense. 09/15/24   Arfeen, Leni DASEN, MD  buPROPion  (WELLBUTRIN  XL) 150 MG 24 hr tablet Take 1 tablet (150 mg total) by mouth every morning. 09/24/24 09/24/25  Carvin Arvella HERO, MD  ezetimibe  (ZETIA ) 10 MG tablet TAKE 1 TABLET BY MOUTH DAILY. 09/27/24   Bacchus, Gloria Z, FNP  levothyroxine  (SYNTHROID ) 150 MCG tablet Take 1 tablet (150 mcg total) by mouth daily. 09/24/24   Bacchus, Meade PEDLAR, FNP  naproxen  (NAPROSYN ) 500 MG tablet Take 1 tablet (500 mg total) by mouth 2 (two) times daily as needed. 03/11/24   Bacchus, Meade PEDLAR, FNP  sildenafil  (VIAGRA ) 100 MG tablet Take 0.5-1 tablets (50-100 mg total) by mouth daily as needed for erectile dysfunction. 10/15/24   Bacchus, Meade PEDLAR, FNP  triamcinolone  ointment (KENALOG ) 0.5 % Apply 1 Application topically 2 (two) times daily. 06/05/23   Bacchus, Meade PEDLAR, FNP  venlafaxine  XR (EFFEXOR -XR) 37.5 MG 24 hr capsule Take 1 capsule (37.5 mg total) by mouth daily. 09/24/24 09/24/25  Carvin Arvella HERO, MD    Family History History reviewed. No pertinent family history.  Social History Social History[1]   Allergies   Patient has no known allergies.   Review of Systems Review of Systems Per HPI  Physical Exam Triage Vital Signs ED Triage Vitals  Encounter Vitals Group     BP 10/20/24 1438 123/68  Girls Systolic BP Percentile --      Girls Diastolic BP Percentile --      Boys Systolic BP Percentile --      Boys Diastolic BP Percentile --      Pulse Rate 10/20/24 1438 (!) 104     Resp 10/20/24 1438 20     Temp 10/20/24 1438 99.9 F (37.7 C)     Temp Source 10/20/24 1438 Oral     SpO2 10/20/24 1438 94 %     Weight --      Height --      Head Circumference --      Peak Flow --      Pain Score 10/20/24 1442 0     Pain Loc --      Pain Education --      Exclude from Growth Chart --    Orthostatic VS for  the past 24 hrs:  BP- Lying Pulse- Lying BP- Sitting Pulse- Sitting BP- Standing at 0 minutes Pulse- Standing at 0 minutes  10/20/24 1444 125/73 97 114/72 98 116/74 103    Updated Vital Signs BP 123/68 (BP Location: Right Arm)   Pulse (!) 104   Temp 99.9 F (37.7 C) (Oral)   Resp 20   SpO2 94%   Visual Acuity Right Eye Distance:   Left Eye Distance:   Bilateral Distance:    Right Eye Near:   Left Eye Near:    Bilateral Near:     Physical Exam Vitals and nursing note reviewed.  Constitutional:      General: He is not in acute distress.    Appearance: Normal appearance. He is ill-appearing.  HENT:     Head: Normocephalic.  Eyes:     Extraocular Movements: Extraocular movements intact.     Pupils: Pupils are equal, round, and reactive to light.  Cardiovascular:     Rate and Rhythm: Regular rhythm. Tachycardia present.     Pulses: Normal pulses.     Heart sounds: Normal heart sounds.  Pulmonary:     Effort: Pulmonary effort is normal. No respiratory distress.     Breath sounds: Normal breath sounds. No stridor. No wheezing, rhonchi or rales.  Abdominal:     General: Bowel sounds are normal.     Palpations: Abdomen is soft.     Tenderness: There is no abdominal tenderness. There is no right CVA tenderness or left CVA tenderness.  Musculoskeletal:     Cervical back: Normal range of motion.  Skin:    General: Skin is warm and dry.  Neurological:     General: No focal deficit present.     Mental Status: He is alert and oriented to person, place, and time.  Psychiatric:        Mood and Affect: Mood normal.        Behavior: Behavior normal.      UC Treatments / Results  Labs (all labs ordered are listed, but only abnormal results are displayed) Labs Reviewed  POCT URINE DIPSTICK - Abnormal; Notable for the following components:      Result Value   Clarity, UA cloudy (*)    Ketones, POC UA small (15) (*)    Blood, UA moderate (*)    POC PROTEIN,UA =100 (*)     Urobilinogen, UA 4.0 (*)    Nitrite, UA Positive (*)    Leukocytes, UA Large (3+) (*)    All other components within normal limits  URINE CULTURE  CBC WITH DIFFERENTIAL/PLATELET  COMPREHENSIVE METABOLIC PANEL WITH GFR    EKG   Radiology No results found.  Procedures Procedures (including critical care time)  Medications Ordered in UC Medications  sodium chloride  0.9 % bolus 1,000 mL (has no administration in time range)  cefTRIAXone  (ROCEPHIN ) injection 1 g (1 g Intramuscular Given 10/20/24 1526)    Initial Impression / Assessment and Plan / UC Course  I have reviewed the triage vital signs and the nursing notes.  Pertinent labs & imaging results that were available during my care of the patient were reviewed by me and considered in my medical decision making (see chart for details).  Patient presents with a 3-day history of pain with urination, decreased urinary output, urinary urgency, hesitancy, frequency, pallor, fatigue, and bilateral flank pain.  On exam, he does not exhibit any CVA tenderness.  Concern for pyelonephritis given his current presentation and symptoms.  Will treat with Rocephin  1 g IM.  Hydration provided with normal saline 1 L bolus.  CBC and CMP are pending.  In the interim, will start patient on Levaquin  750 mg daily for the next 5 days.  Supportive care recommendations were provided and discussed with the patient to include fluids, rest, over-the-counter analgesics, develop any toileting schedule, avoiding caffeine, and to monitor for worsening symptoms.  Patient was given strict ER follow-up precautions.  Patient was in agreement with this plan of care and verbalizes understanding.  All questions were answered.  Patient stable for discharge.   Final Clinical Impressions(s) / UC Diagnoses   Final diagnoses:  Pyelonephritis   Discharge Instructions   None    ED Prescriptions   None    PDMP not reviewed this encounter.     [1]  Social  History Tobacco Use   Smoking status: Never    Passive exposure: Never   Smokeless tobacco: Never  Vaping Use   Vaping status: Former  Substance Use Topics   Alcohol use: Yes    Comment: occasionally   Drug use: Yes    Types: Marijuana     Gilmer Etta PARAS, NP 10/20/24 1631  "

## 2024-10-20 NOTE — Discharge Instructions (Addendum)
 You were given an injection of Rocephin  1 g.  You were also given a liter of normal saline.  A urine culture is pending along with lab results.  You will be contacted if the pending test results are abnormal.  He will also have access to the results via MyChart. Take medication as prescribed.  Start the antibiotic tomorrow. Increase your fluid intake.  Try to drink at least 8-10 8 ounce glasses of water daily while symptoms persist. You may take over-the-counter Tylenol as needed for pain, fever, or general discomfort. Develop a toileting schedule that will allow you to urinate at least every 2 hours. Avoid caffeine such as tea, soda, or coffee while symptoms persist. Go to the emergency department if symptoms do not improve over the next 12 hours or if you develop fever, worsening urinary symptoms, chills, worsening pain around your kidneys, or other concerns. Please follow-up with your primary care physician within the next 7 to 10 days for reevaluation. Follow-up as needed.

## 2024-10-20 NOTE — ED Triage Notes (Addendum)
 Pt reports dysuria, urinary frequency, decreased output, fatigue, weakness x3 days. Pt reports mid back pain that is worse with lying down. Denies fevers, abd pain, nausea. Was referred by e-visit to come in person for evaluation related to symptoms. Home covid/flu test negative.

## 2024-10-22 ENCOUNTER — Ambulatory Visit: Payer: Self-pay

## 2024-10-22 ENCOUNTER — Other Ambulatory Visit: Payer: Self-pay

## 2024-10-22 ENCOUNTER — Emergency Department (HOSPITAL_COMMUNITY)
Admission: EM | Admit: 2024-10-22 | Discharge: 2024-10-22 | Disposition: A | Discharge status: Home / Self Care | Attending: Emergency Medicine | Admitting: Emergency Medicine

## 2024-10-22 ENCOUNTER — Encounter (HOSPITAL_COMMUNITY): Payer: Self-pay

## 2024-10-22 DIAGNOSIS — N390 Urinary tract infection, site not specified: Secondary | ICD-10-CM | POA: Insufficient documentation

## 2024-10-22 DIAGNOSIS — R5383 Other fatigue: Secondary | ICD-10-CM | POA: Insufficient documentation

## 2024-10-22 LAB — CBC WITH DIFFERENTIAL/PLATELET
Abs Immature Granulocytes: 0.03 K/uL (ref 0.00–0.07)
Basophils Absolute: 0.1 x10E3/uL (ref 0.0–0.2)
Basophils Absolute: 0 K/uL (ref 0.0–0.1)
Basophils Relative: 0 %
Basos: 0 %
EOS (ABSOLUTE): 0 x10E3/uL (ref 0.0–0.4)
Eos: 0 %
Eosinophils Absolute: 0 K/uL (ref 0.0–0.5)
Eosinophils Relative: 0 %
HCT: 38.5 % — ABNORMAL LOW (ref 39.0–52.0)
Hematocrit: 43 % (ref 37.5–51.0)
Hemoglobin: 14.4 g/dL (ref 13.0–17.7)
Hemoglobin: 13.1 g/dL (ref 13.0–17.0)
Immature Grans (Abs): 0.3 x10E3/uL — ABNORMAL HIGH (ref 0.0–0.1)
Immature Granulocytes: 1 %
Immature Granulocytes: 1 %
Lymphocytes Absolute: 1.3 x10E3/uL (ref 0.7–3.1)
Lymphocytes Relative: 13 %
Lymphs Abs: 0.8 K/uL (ref 0.7–4.0)
Lymphs: 5 %
MCH: 33.3 pg — ABNORMAL HIGH (ref 26.6–33.0)
MCH: 33.1 pg (ref 26.0–34.0)
MCHC: 33.5 g/dL (ref 31.5–35.7)
MCHC: 34 g/dL (ref 30.0–36.0)
MCV: 99 fL — ABNORMAL HIGH (ref 79–97)
MCV: 97.2 fL (ref 80.0–100.0)
Monocytes Absolute: 2.4 x10E3/uL — ABNORMAL HIGH (ref 0.1–0.9)
Monocytes Absolute: 0.7 K/uL (ref 0.1–1.0)
Monocytes Relative: 12 %
Monocytes: 9 %
Neutro Abs: 4.2 K/uL (ref 1.7–7.7)
Neutrophils Absolute: 23.9 x10E3/uL — ABNORMAL HIGH (ref 1.4–7.0)
Neutrophils Relative %: 74 %
Neutrophils: 85 %
Platelets: 275 x10E3/uL (ref 150–450)
Platelets: 280 K/uL (ref 150–400)
RBC: 4.33 x10E6/uL (ref 4.14–5.80)
RBC: 3.96 MIL/uL — ABNORMAL LOW (ref 4.22–5.81)
RDW: 12.3 % (ref 11.6–15.4)
RDW: 12.3 % (ref 11.5–15.5)
WBC: 27.9 x10E3/uL (ref 3.4–10.8)
WBC: 5.7 K/uL (ref 4.0–10.5)
nRBC: 0 % (ref 0.0–0.2)

## 2024-10-22 LAB — BASIC METABOLIC PANEL WITH GFR
Anion gap: 8 (ref 5–15)
BUN: 10 mg/dL (ref 8–23)
CO2: 28 mmol/L (ref 22–32)
Calcium: 9 mg/dL (ref 8.9–10.3)
Chloride: 96 mmol/L — ABNORMAL LOW (ref 98–111)
Creatinine, Ser: 0.76 mg/dL (ref 0.61–1.24)
GFR, Estimated: >60 mL/min
Glucose, Bld: 92 mg/dL (ref 70–99)
Potassium: 4.7 mmol/L (ref 3.5–5.1)
Sodium: 132 mmol/L — ABNORMAL LOW (ref 135–145)

## 2024-10-22 LAB — COMPREHENSIVE METABOLIC PANEL WITH GFR
ALT: 13 IU/L (ref 0–44)
AST: 16 IU/L (ref 0–40)
Albumin: 4.1 g/dL (ref 3.9–4.9)
Alkaline Phosphatase: 90 IU/L (ref 47–123)
BUN/Creatinine Ratio: 14 (ref 10–24)
BUN: 12 mg/dL (ref 8–27)
Bilirubin Total: 0.7 mg/dL (ref 0.0–1.2)
CO2: 20 mmol/L (ref 20–29)
Calcium: 9.2 mg/dL (ref 8.6–10.2)
Chloride: 95 mmol/L — ABNORMAL LOW (ref 96–106)
Creatinine, Ser: 0.86 mg/dL (ref 0.76–1.27)
Globulin, Total: 2.7 g/dL (ref 1.5–4.5)
Glucose: 97 mg/dL (ref 70–99)
Potassium: 4.1 mmol/L (ref 3.5–5.2)
Sodium: 131 mmol/L — ABNORMAL LOW (ref 134–144)
Total Protein: 6.8 g/dL (ref 6.0–8.5)
eGFR: 94 mL/min/1.73

## 2024-10-22 LAB — URINE CULTURE: Culture: >=100000 — AB

## 2024-10-22 MED ORDER — LACTATED RINGERS IV BOLUS
1000.0000 mL | Freq: Once | INTRAVENOUS | Status: AC
  Administered 2024-10-22: 1000 mL via INTRAVENOUS

## 2024-10-22 NOTE — ED Provider Notes (Signed)
 " [facility] Provider Note   CSN: [id number] Arrival date & time: 10/22/24  1506     Patient presents with: Abnormal Lab   Troy Allen is a 69 y.o. male.   Patient is a 69 year old male who presents emergency department after being called to come to the ER from urgent care secondary to elevated white blood cell count.  He was evaluated there 2 days ago and diagnosed with urinary tract infection and placed on Levaquin  and was given a dose of Rocephin  there.  He did have a white blood cell count of 27,000.  He does note he continues to have some ongoing malaise and fatigue but notes that the pain in his back has improved.  He does admit to lack of p.o. intake.  He notes that the dysuria has improved.  He has had no nausea or vomiting.   Abnormal Lab      Prior to Admission medications  Medication Sig Start Date End Date Taking? Authorizing Provider  amphetamine -dextroamphetamine  (ADDERALL XR) 30 MG 24 hr capsule Take 1 capsule (30 mg total) by mouth daily. 10/13/24   Carvin Arvella HERO, MD  amphetamine -dextroamphetamine  (ADDERALL XR) 30 MG 24 hr capsule Take 1 capsule (30 mg total) by mouth daily. 11/12/24   Carvin Arvella HERO, MD  amphetamine -dextroamphetamine  (ADDERALL XR) 30 MG 24 hr capsule Take 1 capsule (30 mg total) by mouth daily. 12/12/24   Carvin Arvella HERO, MD  amphetamine -dextroamphetamine  (ADDERALL) 15 MG tablet Take two tab twice daily. Check PDMP before dispense. 09/15/24   Arfeen, Leni DASEN, MD  buPROPion  (WELLBUTRIN  XL) 150 MG 24 hr tablet Take 1 tablet (150 mg total) by mouth every morning. 09/24/24 09/24/25  Carvin Arvella HERO, MD  ezetimibe  (ZETIA ) 10 MG tablet TAKE 1 TABLET BY MOUTH DAILY. 09/27/24   Bacchus, Meade PEDLAR, FNP  levofloxacin  (LEVAQUIN ) 750 MG tablet Take 1 tablet (750 mg total) by mouth daily for 5 days. 10/20/24 10/25/24  Leath-Warren, Etta PARAS, NP  levothyroxine  (SYNTHROID ) 150 MCG tablet Take 1 tablet (150 mcg total) by  mouth daily. 09/24/24   Bacchus, Meade PEDLAR, FNP  naproxen  (NAPROSYN ) 500 MG tablet Take 1 tablet (500 mg total) by mouth 2 (two) times daily as needed. 03/11/24   Bacchus, Meade PEDLAR, FNP  sildenafil  (VIAGRA ) 100 MG tablet Take 0.5-1 tablets (50-100 mg total) by mouth daily as needed for erectile dysfunction. 10/15/24   Bacchus, Gloria Z, FNP  triamcinolone  ointment (KENALOG ) 0.5 % Apply 1 Application topically 2 (two) times daily. 06/05/23   Bacchus, Meade PEDLAR, FNP  venlafaxine  XR (EFFEXOR -XR) 37.5 MG 24 hr capsule Take 1 capsule (37.5 mg total) by mouth daily. 09/24/24 09/24/25  Carvin Arvella HERO, MD    Allergies: Patient has no known allergies.    Review of Systems  All other systems reviewed and are negative.   Updated Vital Signs BP 115/78 (BP Location: Right Arm)   Pulse 84   Temp 98.4 F (36.9 C) (Oral)   Resp 18   Wt 73 kg   SpO2 99%   BMI 24.47 kg/m   Physical Exam Vitals and nursing note reviewed.  Constitutional:      General: He is not in acute distress.    Appearance: Normal appearance. He is not ill-appearing.  HENT:     Head: Normocephalic and atraumatic.     Nose: Nose normal.     Mouth/Throat:     Mouth: Mucous membranes are moist.  Eyes:  Extraocular Movements: Extraocular movements intact.     Conjunctiva/sclera: Conjunctivae normal.     Pupils: Pupils are equal, round, and reactive to light.  Cardiovascular:     Rate and Rhythm: Normal rate and regular rhythm.     Pulses: Normal pulses.     Heart sounds: Normal heart sounds. No murmur heard.    No gallop.  Pulmonary:     Effort: Pulmonary effort is normal. No respiratory distress.     Breath sounds: Normal breath sounds. No stridor. No wheezing, rhonchi or rales.  Abdominal:     General: Abdomen is flat. Bowel sounds are normal. There is no distension.     Palpations: Abdomen is soft.     Tenderness: There is no abdominal tenderness. There is no guarding.  Musculoskeletal:        General: Normal range  of motion.     Cervical back: Normal range of motion and neck supple. No rigidity or tenderness.  Skin:    General: Skin is warm and dry.     Findings: No rash.  Neurological:     General: No focal deficit present.     Mental Status: He is alert and oriented to person, place, and time. Mental status is at baseline.     Cranial Nerves: No cranial nerve deficit.     Sensory: No sensory deficit.     Motor: No weakness.     Coordination: Coordination normal.     Gait: Gait normal.  Psychiatric:        Mood and Affect: Mood normal.        Behavior: Behavior normal.        Thought Content: Thought content normal.        Judgment: Judgment normal.     (all labs ordered are listed, but only abnormal results are displayed) Labs Reviewed  CBC WITH DIFFERENTIAL/PLATELET  BASIC METABOLIC PANEL WITH GFR    EKG: None  Radiology: No results found.   Procedures   Medications Ordered in the ED  lactated ringers  bolus 1,000 mL (has no administration in time range)                                    Medical Decision Making Patient is doing very well at this time and is stable for discharge home.  Leukocytosis has resolved.  Did review his culture results which was sensitive to Levaquin  and he will complete this course.  Will provide IV fluids in the emergency department.  Do not suspect any further workup is warranted at this time as he is otherwise asymptomatic other than some associated fatigue.  Abdominal exam is benign with no focal tenderness throughout and he has no CVA tenderness.  Do not suspect ureterolithiasis or nephrolithiasis.  Close follow-up with PCP was discussed as well as strict precautions for any new or worsening symptoms.  Patient voiced understanding and had no additional questions.  Amount and/or Complexity of Data Reviewed Labs: ordered.        Final diagnoses:  None    ED Discharge Orders     None          Daralene Lonni BIRCH, PA-C 10/22/24  1750  "

## 2024-10-22 NOTE — ED Triage Notes (Signed)
 Pt reports he went to UC for dysuria and he received IV abx and oral meds to take at home.  Pt is feeling better but they told him to come to ER to recheck WBC as his was critically elevated.

## 2024-10-22 NOTE — Discharge Instructions (Signed)
 Please finish the course of antibiotics you were prescribed.  Follow-up closely with your primary care doctor on an outpatient basis.  Return to emergency department immediately for any new or worsening symptoms.

## 2024-10-22 NOTE — Telephone Encounter (Signed)
Attempted to reach patient x2. LVM

## 2024-10-25 ENCOUNTER — Telehealth: Admitting: Family Medicine

## 2024-10-25 DIAGNOSIS — N390 Urinary tract infection, site not specified: Secondary | ICD-10-CM | POA: Diagnosis not present

## 2024-10-25 NOTE — Assessment & Plan Note (Signed)
 Lingering fatigue may be related to recent infection; advised adequate rest, hydration, and gradual return to normal activities. Will repeat urinalysis and urine culture  Advised to follow up in clinic if symptoms return, worsen, or if fatigue does not continue to improve. Instructed to seek urgent care for fever, worsening pain, vomiting, or signs of systemic infection.

## 2024-10-25 NOTE — Progress Notes (Signed)
 "  Virtual Visit via Video Note  I connected with Troy Allen on 10/25/2024 at  1:20 PM EST by a video enabled telemedicine application and verified that I am speaking with the correct person using two identifiers.  Patient Location: Home Provider Location: Home Office  I discussed the limitations, risks, security, and privacy concerns of performing an evaluation and management service by video and the availability of in person appointments. I also discussed with the patient that there may be a patient responsible charge related to this service. The patient expressed understanding and agreed to proceed.  Subjective: PCP: Edman Meade PEDLAR, FNP  Chief Complaint  Patient presents with   Urinary Tract Infection    Last wed he went to UC and was told he had a bad kidney infection and was given antibiotics and told to follow up with pcp in 7-10 days to ensure resolution. Took the last levaquin  today   HPI The patient was recently evaluated and treated in the emergency department on 10/22/2024 for a urinary tract infection. He reports that he did not complete the full course of the prescribed antibiotic. He states that he is feeling better overall but continues to experience lingering fatigue. No other symptoms are reported.   ROS: Per HPI Current Medications[1]  Observations/Objective: There were no vitals filed for this visit. Physical Exam Speech is clear and coherent with logical content.  Patient is alert and oriented at baseline.   Assessment and Plan: Urinary tract infection without hematuria, site unspecified Assessment & Plan: Lingering fatigue may be related to recent infection; advised adequate rest, hydration, and gradual return to normal activities. Will repeat urinalysis and urine culture  Advised to follow up in clinic if symptoms return, worsen, or if fatigue does not continue to improve. Instructed to seek urgent care for fever, worsening pain, vomiting, or signs of  systemic infection.   Orders: -     Urinalysis -     Urine Culture -     UA/M w/rflx Culture, Routine    Follow Up Instructions: No follow-ups on file.   I discussed the assessment and treatment plan with the patient. The patient was provided an opportunity to ask questions, and all were answered. The patient agreed with the plan and demonstrated an understanding of the instructions.   The patient was advised to call back or seek an in-person evaluation if the symptoms worsen or if the condition fails to improve as anticipated.  The above assessment and management plan was discussed with the patient. The patient verbalized understanding of and has agreed to the management plan.   Meade PEDLAR Edman, FNP     [1]  Current Outpatient Medications:    amphetamine -dextroamphetamine  (ADDERALL XR) 30 MG 24 hr capsule, Take 1 capsule (30 mg total) by mouth daily., Disp: 60 capsule, Rfl: 0   [START ON 11/12/2024] amphetamine -dextroamphetamine  (ADDERALL XR) 30 MG 24 hr capsule, Take 1 capsule (30 mg total) by mouth daily., Disp: 60 capsule, Rfl: 0   [START ON 12/12/2024] amphetamine -dextroamphetamine  (ADDERALL XR) 30 MG 24 hr capsule, Take 1 capsule (30 mg total) by mouth daily., Disp: 60 capsule, Rfl: 0   amphetamine -dextroamphetamine  (ADDERALL) 15 MG tablet, Take two tab twice daily. Check PDMP before dispense., Disp: 120 tablet, Rfl: 0   buPROPion  (WELLBUTRIN  XL) 150 MG 24 hr tablet, Take 1 tablet (150 mg total) by mouth every morning., Disp: 90 tablet, Rfl: 0   ezetimibe  (ZETIA ) 10 MG tablet, TAKE 1 TABLET BY MOUTH DAILY.,  Disp: 90 tablet, Rfl: 3   levofloxacin  (LEVAQUIN ) 750 MG tablet, Take 1 tablet (750 mg total) by mouth daily for 5 days., Disp: 5 tablet, Rfl: 0   levothyroxine  (SYNTHROID ) 150 MCG tablet, Take 1 tablet (150 mcg total) by mouth daily., Disp: 90 tablet, Rfl: 1   naproxen  (NAPROSYN ) 500 MG tablet, Take 1 tablet (500 mg total) by mouth 2 (two) times daily as needed., Disp: 60  tablet, Rfl: 0   sildenafil  (VIAGRA ) 100 MG tablet, Take 0.5-1 tablets (50-100 mg total) by mouth daily as needed for erectile dysfunction., Disp: 10 tablet, Rfl: 2   triamcinolone  ointment (KENALOG ) 0.5 %, Apply 1 Application topically 2 (two) times daily., Disp: 30 g, Rfl: 0   venlafaxine  XR (EFFEXOR -XR) 37.5 MG 24 hr capsule, Take 1 capsule (37.5 mg total) by mouth daily., Disp: 90 capsule, Rfl: 0  "

## 2024-10-26 LAB — UA/M W/RFLX CULTURE, ROUTINE
Bilirubin, UA: NEGATIVE
Glucose, UA: NEGATIVE
Ketones, UA: NEGATIVE
Leukocytes,UA: NEGATIVE
Nitrite, UA: NEGATIVE
Protein,UA: NEGATIVE
RBC, UA: NEGATIVE
Specific Gravity, UA: 1.009 (ref 1.005–1.030)
Urobilinogen, Ur: 0.2 mg/dL (ref 0.2–1.0)
pH, UA: 5.5 (ref 5.0–7.5)

## 2024-10-26 LAB — MICROSCOPIC EXAMINATION
Bacteria, UA: NONE SEEN
Casts: NONE SEEN /LPF
Epithelial Cells (non renal): NONE SEEN /HPF (ref 0–10)
RBC, Urine: NONE SEEN /HPF (ref 0–2)

## 2024-10-27 ENCOUNTER — Ambulatory Visit: Payer: Self-pay | Admitting: Family Medicine

## 2024-10-29 ENCOUNTER — Ambulatory Visit: Payer: Self-pay | Admitting: Family Medicine

## 2024-12-02 ENCOUNTER — Telehealth (HOSPITAL_COMMUNITY): Admitting: Psychiatry

## 2025-02-18 ENCOUNTER — Ambulatory Visit
